# Patient Record
Sex: Female | Born: 1970
Health system: Southern US, Community
[De-identification: ages and names within clinical notes are randomized; demographics above are authoritative.]

## PROBLEM LIST (undated history)

## (undated) DIAGNOSIS — Z973 Presence of spectacles and contact lenses: Secondary | ICD-10-CM

## (undated) DIAGNOSIS — I10 Essential (primary) hypertension: Secondary | ICD-10-CM

## (undated) DIAGNOSIS — D649 Anemia, unspecified: Secondary | ICD-10-CM

## (undated) DIAGNOSIS — C50919 Malignant neoplasm of unspecified site of unspecified female breast: Secondary | ICD-10-CM

## (undated) DIAGNOSIS — Z923 Personal history of irradiation: Secondary | ICD-10-CM

## (undated) DIAGNOSIS — R12 Heartburn: Secondary | ICD-10-CM

## (undated) HISTORY — PX: MOUTH SURGERY: SHX715

---

## 2000-08-15 ENCOUNTER — Encounter: Payer: Self-pay | Admitting: Family Medicine

## 2000-08-15 ENCOUNTER — Encounter: Admission: RE | Admit: 2000-08-15 | Discharge: 2000-08-15 | Payer: Self-pay | Admitting: Family Medicine

## 2001-09-28 ENCOUNTER — Other Ambulatory Visit: Admission: RE | Admit: 2001-09-28 | Discharge: 2001-09-28 | Payer: Self-pay | Admitting: Family Medicine

## 2003-07-26 HISTORY — PX: LIPOMA EXCISION: SHX5283

## 2003-09-18 ENCOUNTER — Encounter: Admission: RE | Admit: 2003-09-18 | Discharge: 2003-10-06 | Payer: Self-pay | Admitting: Family Medicine

## 2003-10-15 ENCOUNTER — Ambulatory Visit (HOSPITAL_COMMUNITY): Admission: RE | Admit: 2003-10-15 | Discharge: 2003-10-15 | Payer: Self-pay | Admitting: General Surgery

## 2003-10-15 ENCOUNTER — Encounter (INDEPENDENT_AMBULATORY_CARE_PROVIDER_SITE_OTHER): Payer: Self-pay | Admitting: *Deleted

## 2003-10-15 ENCOUNTER — Ambulatory Visit (HOSPITAL_BASED_OUTPATIENT_CLINIC_OR_DEPARTMENT_OTHER): Admission: RE | Admit: 2003-10-15 | Discharge: 2003-10-15 | Payer: Self-pay | Admitting: General Surgery

## 2005-01-18 ENCOUNTER — Emergency Department (HOSPITAL_COMMUNITY): Admission: EM | Admit: 2005-01-18 | Discharge: 2005-01-18 | Payer: Self-pay | Admitting: Emergency Medicine

## 2007-01-17 ENCOUNTER — Emergency Department (HOSPITAL_COMMUNITY): Admission: EM | Admit: 2007-01-17 | Discharge: 2007-01-17 | Payer: Self-pay | Admitting: Emergency Medicine

## 2007-09-13 ENCOUNTER — Other Ambulatory Visit: Admission: RE | Admit: 2007-09-13 | Discharge: 2007-09-13 | Payer: Self-pay | Admitting: Obstetrics and Gynecology

## 2010-12-10 NOTE — Op Note (Signed)
NAME:  Erica Roberts, Erica Roberts                       ACCOUNT NO.:  [id number]   MEDICAL RECORD NO.:  [id number]                   PATIENT TYPE:  AMB   LOCATION:  DSC                                  FACILITY:  [facility]   PHYSICIAN:  Leonie Man, M.D.                DATE OF BIRTH:  11/20/1970   DATE OF PROCEDURE:  10/15/2003  DATE OF DISCHARGE:                                 OPERATIVE REPORT   PREOPERATIVE DIAGNOSIS:  Lipoma, left shoulder.   POSTOPERATIVE DIAGNOSIS:  Lipoma, left shoulder.   OPERATION PERFORMED:  Excision of lipoma of the left shoulder (6 cm).   SURGEON:  Leonie Man, M.D.   ANESTHESIA:  General.   INDICATIONS FOR PROCEDURE:  The patient is a 40 year old woman with an  enlarging mass over the distal third of the clavicle which has been  enlarging in size and causing some problems with her bra strap and other  clothing.  She comes to the operating room now for excision of this mass.  The risks and potential benefits of surgery have been discussed with the  patient and she understands and consents.   DESCRIPTION OF PROCEDURE:  The patient was induced with general anesthesia  and the left shoulder was prepped and draped to be included in the sterile  operative field.  An incision longitudinally along the left clavicle was  deepened through the skin and subcutaneous tissue down to the capsule.  The  capsule was opened.  The lipoma was dissected free from surrounding soft  tissue carrying it all the way down to the platysma muscles.  The lipoma was  removed and forwarded for pathologic evaluation.   Hemostasis assured with electrocautery.  Subcutaneous tissues closed with  interrupted 3-0 Vicryl sutures.  Skin closed with running 5-0 Monocryl  suture and then reinforced with Steri-Strips and a sterile compressive  dressing applied.  Anesthetic was reversed and the patient removed from the  operating room to the recovery room in stable condition, having tolerated  the  procedure well.                                               Leonie Man, M.D.    PB/MEDQ  D:  10/15/2003  T:  10/16/2003  Job:  [id number]

## 2011-05-11 LAB — URINE MICROSCOPIC-ADD ON

## 2011-05-11 LAB — URINALYSIS, ROUTINE W REFLEX MICROSCOPIC
Bilirubin Urine: NEGATIVE
Glucose, UA: NEGATIVE
Ketones, ur: NEGATIVE
Nitrite: NEGATIVE
Protein, ur: >300 — AB
Specific Gravity, Urine: 1.021
Urobilinogen, UA: 1
pH: 6.5

## 2011-05-11 LAB — POCT PREGNANCY, URINE
Operator id: 27065
Preg Test, Ur: NEGATIVE

## 2012-07-25 DIAGNOSIS — Z923 Personal history of irradiation: Secondary | ICD-10-CM

## 2012-07-25 HISTORY — DX: Personal history of irradiation: Z92.3

## 2013-01-31 ENCOUNTER — Other Ambulatory Visit: Payer: Self-pay | Admitting: Obstetrics and Gynecology

## 2013-02-20 ENCOUNTER — Other Ambulatory Visit: Payer: Self-pay

## 2013-02-20 DIAGNOSIS — Z1231 Encounter for screening mammogram for malignant neoplasm of breast: Secondary | ICD-10-CM

## 2013-02-21 ENCOUNTER — Other Ambulatory Visit: Payer: Self-pay | Admitting: Obstetrics and Gynecology

## 2013-02-25 ENCOUNTER — Other Ambulatory Visit: Payer: Self-pay | Admitting: Obstetrics and Gynecology

## 2013-02-25 NOTE — H&P (Signed)
Erica Roberts is a 42 y.o.  P 1-0-1-1 presents  for hysterectomy because of symptomatic uterine fibroids and menorrhagia.  For the past six months, in spite of using Nuva Ring, patient's menstrual flow has lasted 7 days and required her to change her protection every 30 minutes to a hour. She denies any significant cramping  but admits to positional dyspareunia, urinary frequency, rare post coital bleeding, random one day inter-menstrual spotting  and lower back pain.  She denies incontinence, constipation, pelvic pain, or vaginitis symptoms.  A TSH and Prolactin in July 2014 were normal and endometrial biopsy did not reveal any endometrial cell, endocervical sampling showed benigh mucosal  cells.  A pelvic ultrasound at that same time revealed a uterus: 8.83 x 8.13 x 6.68 cm, endometrium-0.481 cm with #3 fibroids: posterior-2.80 x 2.50 x 3.4 cm; left anterior-4.3 x 3.7 x 3.7 cm and posterior- 2.7 x 2.4 x 2.2 cm;  right ovary-2.18 x 1.31 x 1.51 cm and left ovary-2.67 x 1.47 x 1.09 cm.  A review of both medical and surgical management options were given to the patient however she desired to pursue definitive therapy in the form of hysterectomy.  Past Medical History  OB History: G 2 P 1-0-1-1 C-section, 1992  GYN History: menarche:42 YO;  LMP:02/04/2013    Contracepton NuvaRing vaginal inserts  The patient denies history of sexually transmitted disease.  Remote h istory of abnormal PAP smears that were simply repeated and returned normal;  Last PAP smear: 09/2012  Medical History: Hypertension  Surgical History: Negative Denies history of blood transfusions  Family History: Aneurysm, Hypertension and Diabetes Mellitus  Social History:   Divorced and employed with [employer]-works from home:  Admits to occasional alcolhol but denies tobacco or illicit drug use.  Medications:  Benicar  HCT 40/25  daily Nuva Ring as directed  NKDA Denies sensitivity to peanuts, shellfish, soy, latex  or adhesives.    ROS: Admits to reading glasses, urinary frequency, and  lower back pain;   Denies headache, vision changes, nasal congestion, dysphagia, tinnitus, dizziness, hoarseness, cough,  chest pain, shortness of breath, nausea, vomiting, diarrhea,constipation,  urinary urgency  dysuria, hematuria, vaginitis symptoms, pelvic pain, swelling of joints,easy bruising,  myalgias, arthralgias, skin rashes, unexplained weight loss and except as is mentioned in the history of present illness, patient's review of systems is otherwise negative.    Physical Exam  Bp: 126/86  P: 100    R: 16  Temp.: 98.3 degrees  Height: 5' 8.5"  Weight: 232   BMI: 34.8  Neck: supple without masses or thyromegaly Lungs: clear to auscultation Heart: regular rate and rhythm Abdomen: soft, non-tender and no organomegaly Pelvic:EGBUS- wnl; vagina-normal rugawith Nuva Ring in place,  uterus-ULNS, irregular,  cervix without lesions or motion tenderness; adnexae-no tenderness or masses Extremities:  no clubbing, cyanosis or edema   Assesment:   Symptomatic Uterine Fibroids                         Menorrhagia   Disposition:  A discussion was held with patient regarding the indication for her procedure(s) along with the risks, which include but are not limited to: reaction to anesthesia, damage to adjacent organs, infection, excessive bleeding, pelvic prolapse and the possible need for an open abdominal incision.  A Miralax Bowel Prep was given and reviewed to be completed the day before surgery. The patient verbalized understanding of these risks and pre-operative instructions and has consented to proceed  with a Total Laparoscopic Hysterectomy with Bilateral Salpingectomy, Cystoscopy, possible  Laparoscopically Assisted Vaginal Hysterectomy,  possible Total Abdominal Hysterectomy at [facility] [place] on March 04, 2013 at 2. p.m.   CSN# [id number]   Baylin Gamblin J. Lowell Guitar, PA-C  for Dr. Woodroe Mode.  Su Hilt

## 2013-02-27 ENCOUNTER — Ambulatory Visit
Admission: RE | Admit: 2013-02-27 | Discharge: 2013-02-27 | Disposition: A | Discharge status: Home / Self Care | Payer: BC Managed Care – PPO | Source: Ambulatory Visit

## 2013-02-27 ENCOUNTER — Other Ambulatory Visit: Payer: Self-pay | Admitting: Family Medicine

## 2013-02-27 DIAGNOSIS — R928 Other abnormal and inconclusive findings on diagnostic imaging of breast: Secondary | ICD-10-CM

## 2013-02-27 DIAGNOSIS — Z1231 Encounter for screening mammogram for malignant neoplasm of breast: Secondary | ICD-10-CM

## 2013-02-28 ENCOUNTER — Encounter (HOSPITAL_COMMUNITY): Payer: Self-pay

## 2013-02-28 ENCOUNTER — Encounter (HOSPITAL_COMMUNITY): Payer: Self-pay | Admitting: Pharmacist

## 2013-02-28 ENCOUNTER — Encounter (HOSPITAL_COMMUNITY)
Admission: RE | Admit: 2013-02-28 | Discharge: 2013-02-28 | Disposition: A | Discharge status: Home / Self Care | Payer: BC Managed Care – PPO | Source: Ambulatory Visit | Attending: Obstetrics and Gynecology | Admitting: Obstetrics and Gynecology

## 2013-02-28 ENCOUNTER — Other Ambulatory Visit: Payer: Self-pay

## 2013-02-28 HISTORY — DX: Heartburn: R12

## 2013-02-28 HISTORY — DX: Essential (primary) hypertension: I10

## 2013-02-28 HISTORY — DX: Anemia, unspecified: D64.9

## 2013-02-28 LAB — CBC
Hemoglobin: 11.6 g/dL — ABNORMAL LOW (ref 12.0–15.0)
MCH: 29 pg (ref 26.0–34.0)
MCV: 87 fL (ref 78.0–100.0)
Platelets: 313 10*3/uL (ref 150–400)
RBC: 4 MIL/uL (ref 3.87–5.11)
WBC: 5.8 10*3/uL (ref 4.0–10.5)

## 2013-02-28 LAB — BASIC METABOLIC PANEL
CO2: 26 mEq/L (ref 19–32)
GFR calc non Af Amer: 84 mL/min — ABNORMAL LOW (ref >90)
Glucose, Bld: 84 mg/dL (ref 70–99)
Potassium: 4.1 mEq/L (ref 3.5–5.1)
Sodium: 142 mEq/L (ref 135–145)

## 2013-02-28 NOTE — Patient Instructions (Addendum)
Your procedure is scheduled on:03/04/13  Enter through the Main Entrance at :1230pm Pick up desk phone and dial [phone] and inform us of your arrival.  Please call [phone] if you have any problems the morning of surgery.  Remember: Do not eat food after midnight: Sunday Clear liquids are ok until:10 am Monday   You may brush your teeth the morning of surgery.  Take these meds the morning of surgery with a sip of water:BP med  DO NOT wear jewelry, eye make-up, lipstick,body lotion, or dark fingernail polish.  (Polished toes are ok) You may wear deodorant.  If you are to be admitted after surgery, leave suitcase in car until your room has been assigned. Patients discharged on the day of surgery will not be allowed to drive home. Wear loose fitting, comfortable clothes for your ride home.

## 2013-03-03 MED ORDER — DEXTROSE 5 % IV SOLN
2.0000 g | INTRAVENOUS | Status: AC
  Administered 2013-03-04: 2 g via INTRAVENOUS
  Filled 2013-03-03: qty 2

## 2013-03-04 ENCOUNTER — Ambulatory Visit (HOSPITAL_COMMUNITY): Payer: BC Managed Care – PPO | Admitting: Anesthesiology

## 2013-03-04 ENCOUNTER — Encounter (HOSPITAL_COMMUNITY): Payer: Self-pay | Admitting: Anesthesiology

## 2013-03-04 ENCOUNTER — Observation Stay (HOSPITAL_COMMUNITY)
Admission: RE | Admit: 2013-03-04 | Discharge: 2013-03-05 | Disposition: A | Discharge status: Home / Self Care | Payer: BC Managed Care – PPO | Source: Ambulatory Visit | Attending: Obstetrics and Gynecology | Admitting: Obstetrics and Gynecology

## 2013-03-04 ENCOUNTER — Encounter (HOSPITAL_COMMUNITY)
Admission: RE | Disposition: A | Discharge status: Home / Self Care | Payer: Self-pay | Source: Ambulatory Visit | Attending: Obstetrics and Gynecology

## 2013-03-04 DIAGNOSIS — A5485 Gonococcal peritonitis: Secondary | ICD-10-CM | POA: Insufficient documentation

## 2013-03-04 DIAGNOSIS — D252 Subserosal leiomyoma of uterus: Secondary | ICD-10-CM | POA: Insufficient documentation

## 2013-03-04 DIAGNOSIS — D250 Submucous leiomyoma of uterus: Secondary | ICD-10-CM | POA: Insufficient documentation

## 2013-03-04 DIAGNOSIS — D251 Intramural leiomyoma of uterus: Secondary | ICD-10-CM | POA: Insufficient documentation

## 2013-03-04 DIAGNOSIS — N920 Excessive and frequent menstruation with regular cycle: Principal | ICD-10-CM | POA: Insufficient documentation

## 2013-03-04 DIAGNOSIS — I10 Essential (primary) hypertension: Secondary | ICD-10-CM | POA: Insufficient documentation

## 2013-03-04 HISTORY — PX: ABDOMINAL HYSTERECTOMY: SHX81

## 2013-03-04 HISTORY — PX: CYSTOSCOPY: SHX5120

## 2013-03-04 HISTORY — PX: LAPAROSCOPIC HYSTERECTOMY: SHX1926

## 2013-03-04 LAB — HCG, SERUM, QUALITATIVE: Preg, Serum: NEGATIVE

## 2013-03-04 SURGERY — HYSTERECTOMY, TOTAL, LAPAROSCOPIC
Anesthesia: General

## 2013-03-04 MED ORDER — FENTANYL CITRATE 0.05 MG/ML IJ SOLN
25.0000 ug | INTRAMUSCULAR | Status: DC | PRN
  Administered 2013-03-04: 50 ug via INTRAVENOUS

## 2013-03-04 MED ORDER — MIDAZOLAM HCL 5 MG/5ML IJ SOLN
INTRAMUSCULAR | Status: DC | PRN
  Administered 2013-03-04: 2 mg via INTRAVENOUS

## 2013-03-04 MED ORDER — LIDOCAINE HCL (CARDIAC) 20 MG/ML IV SOLN
INTRAVENOUS | Status: DC | PRN
  Administered 2013-03-04: 80 mg via INTRAVENOUS

## 2013-03-04 MED ORDER — DEXAMETHASONE SODIUM PHOSPHATE 10 MG/ML IJ SOLN
INTRAMUSCULAR | Status: DC | PRN
  Administered 2013-03-04: 10 mg via INTRAVENOUS

## 2013-03-04 MED ORDER — OXYCODONE-ACETAMINOPHEN 5-325 MG PO TABS
1.0000 | ORAL_TABLET | ORAL | Status: DC | PRN
  Administered 2013-03-05: 2 via ORAL
  Filled 2013-03-04: qty 2

## 2013-03-04 MED ORDER — GLYCOPYRROLATE 0.2 MG/ML IJ SOLN
INTRAMUSCULAR | Status: DC | PRN
  Administered 2013-03-04: 0.6 mg via INTRAVENOUS

## 2013-03-04 MED ORDER — HYDROCHLOROTHIAZIDE 25 MG PO TABS
25.0000 mg | ORAL_TABLET | Freq: Every day | ORAL | Status: DC
Start: 2013-03-05 — End: 2013-03-05
  Administered 2013-03-05: 25 mg via ORAL
  Filled 2013-03-04 (×2): qty 1

## 2013-03-04 MED ORDER — MIDAZOLAM HCL 2 MG/2ML IJ SOLN
INTRAMUSCULAR | Status: AC
  Filled 2013-03-04: qty 2

## 2013-03-04 MED ORDER — GLYCOPYRROLATE 0.2 MG/ML IJ SOLN
INTRAMUSCULAR | Status: AC
  Filled 2013-03-04: qty 3

## 2013-03-04 MED ORDER — LACTATED RINGERS IV SOLN
INTRAVENOUS | Status: DC
  Administered 2013-03-04 (×3): via INTRAVENOUS

## 2013-03-04 MED ORDER — ONDANSETRON HCL 4 MG/2ML IJ SOLN
INTRAMUSCULAR | Status: AC
  Filled 2013-03-04: qty 2

## 2013-03-04 MED ORDER — BUPIVACAINE HCL (PF) 0.25 % IJ SOLN
INTRAMUSCULAR | Status: AC
  Filled 2013-03-04: qty 30

## 2013-03-04 MED ORDER — MIDAZOLAM HCL 2 MG/2ML IJ SOLN: 0.5000 mg | Freq: Once | INTRAMUSCULAR | Status: DC | PRN

## 2013-03-04 MED ORDER — BACITRACIN-NEOMYCIN-POLYMYXIN 400-5-5000 EX OINT
TOPICAL_OINTMENT | CUTANEOUS | Status: AC
  Filled 2013-03-04: qty 1

## 2013-03-04 MED ORDER — LACTATED RINGERS IV SOLN
INTRAVENOUS | Status: DC
  Administered 2013-03-05: 01:00:00 via INTRAVENOUS

## 2013-03-04 MED ORDER — PROPOFOL 10 MG/ML IV EMUL
INTRAVENOUS | Status: AC
  Filled 2013-03-04: qty 20

## 2013-03-04 MED ORDER — KETOROLAC TROMETHAMINE 30 MG/ML IJ SOLN: 15.0000 mg | Freq: Once | INTRAMUSCULAR | Status: DC | PRN

## 2013-03-04 MED ORDER — MIDAZOLAM HCL 5 MG/5ML IJ SOLN: INTRAMUSCULAR | Status: DC | PRN

## 2013-03-04 MED ORDER — SODIUM CHLORIDE 0.9 % IJ SOLN: 9.0000 mL | INTRAMUSCULAR | Status: DC | PRN

## 2013-03-04 MED ORDER — HYDROMORPHONE HCL PF 1 MG/ML IJ SOLN
INTRAMUSCULAR | Status: AC
  Filled 2013-03-04: qty 1

## 2013-03-04 MED ORDER — PROMETHAZINE HCL 25 MG/ML IJ SOLN: 6.2500 mg | INTRAMUSCULAR | Status: DC | PRN

## 2013-03-04 MED ORDER — FENTANYL CITRATE 0.05 MG/ML IJ SOLN
INTRAMUSCULAR | Status: AC
  Filled 2013-03-04: qty 5

## 2013-03-04 MED ORDER — NALOXONE HCL 0.4 MG/ML IJ SOLN: 0.4000 mg | INTRAMUSCULAR | Status: DC | PRN

## 2013-03-04 MED ORDER — MEPERIDINE HCL 25 MG/ML IJ SOLN: 6.2500 mg | INTRAMUSCULAR | Status: DC | PRN

## 2013-03-04 MED ORDER — INDIGOTINDISULFONATE SODIUM 8 MG/ML IJ SOLN
INTRAMUSCULAR | Status: DC | PRN
  Administered 2013-03-04: 5 mL via INTRAVENOUS

## 2013-03-04 MED ORDER — DEXAMETHASONE SODIUM PHOSPHATE 10 MG/ML IJ SOLN
INTRAMUSCULAR | Status: AC
  Filled 2013-03-04: qty 1

## 2013-03-04 MED ORDER — PROPOFOL 10 MG/ML IV BOLUS
INTRAVENOUS | Status: DC | PRN
  Administered 2013-03-04: 200 mg via INTRAVENOUS

## 2013-03-04 MED ORDER — NEOSTIGMINE METHYLSULFATE 1 MG/ML IJ SOLN
INTRAMUSCULAR | Status: DC | PRN
  Administered 2013-03-04: 3 mg via INTRAVENOUS

## 2013-03-04 MED ORDER — FENTANYL CITRATE 0.05 MG/ML IJ SOLN
INTRAMUSCULAR | Status: DC | PRN
  Administered 2013-03-04: 25 ug via INTRAVENOUS
  Administered 2013-03-04 (×4): 50 ug via INTRAVENOUS
  Administered 2013-03-04: 25 ug via INTRAVENOUS
  Administered 2013-03-04 (×2): 50 ug via INTRAVENOUS
  Administered 2013-03-04: 100 ug via INTRAVENOUS

## 2013-03-04 MED ORDER — DIPHENHYDRAMINE HCL 12.5 MG/5ML PO ELIX
12.5000 mg | ORAL_SOLUTION | Freq: Four times a day (QID) | ORAL | Status: DC | PRN
  Administered 2013-03-05: 12.5 mg via ORAL
  Filled 2013-03-04: qty 5

## 2013-03-04 MED ORDER — LIDOCAINE HCL (CARDIAC) 20 MG/ML IV SOLN
INTRAVENOUS | Status: AC
  Filled 2013-03-04: qty 5

## 2013-03-04 MED ORDER — LIDOCAINE HCL (CARDIAC) 20 MG/ML IV SOLN: INTRAVENOUS | Status: DC | PRN

## 2013-03-04 MED ORDER — ONDANSETRON HCL 4 MG/2ML IJ SOLN: 4.0000 mg | Freq: Four times a day (QID) | INTRAMUSCULAR | Status: DC | PRN

## 2013-03-04 MED ORDER — HYDROMORPHONE 0.3 MG/ML IV SOLN
INTRAVENOUS | Status: DC
  Administered 2013-03-04: 22:00:00 via INTRAVENOUS
  Administered 2013-03-05: 1.59 mg via INTRAVENOUS
  Administered 2013-03-05: 2 mL via INTRAVENOUS
  Administered 2013-03-05: 1.79 mg via INTRAVENOUS
  Filled 2013-03-04: qty 25

## 2013-03-04 MED ORDER — KETOROLAC TROMETHAMINE 30 MG/ML IJ SOLN
INTRAMUSCULAR | Status: DC | PRN
  Administered 2013-03-04: 30 mg via INTRAVENOUS

## 2013-03-04 MED ORDER — ROCURONIUM BROMIDE 50 MG/5ML IV SOLN
INTRAVENOUS | Status: AC
  Filled 2013-03-04: qty 1

## 2013-03-04 MED ORDER — FENTANYL CITRATE 0.05 MG/ML IJ SOLN
INTRAMUSCULAR | Status: AC
  Administered 2013-03-04: 50 ug via INTRAVENOUS
  Filled 2013-03-04: qty 2

## 2013-03-04 MED ORDER — DIPHENHYDRAMINE HCL 50 MG/ML IJ SOLN: 12.5000 mg | Freq: Four times a day (QID) | INTRAMUSCULAR | Status: DC | PRN

## 2013-03-04 MED ORDER — KETOROLAC TROMETHAMINE 30 MG/ML IJ SOLN: 30.0000 mg | Freq: Four times a day (QID) | INTRAMUSCULAR | Status: DC

## 2013-03-04 MED ORDER — IRBESARTAN 300 MG PO TABS
300.0000 mg | ORAL_TABLET | Freq: Every day | ORAL | Status: DC
  Administered 2013-03-05: 300 mg via ORAL
  Filled 2013-03-04 (×2): qty 1

## 2013-03-04 MED ORDER — VASOPRESSIN 20 UNIT/ML IJ SOLN
INTRAMUSCULAR | Status: AC
  Filled 2013-03-04: qty 1

## 2013-03-04 MED ORDER — IBUPROFEN 600 MG PO TABS
600.0000 mg | ORAL_TABLET | Freq: Four times a day (QID) | ORAL | Status: DC | PRN
  Administered 2013-03-05: 600 mg via ORAL
  Filled 2013-03-04: qty 1

## 2013-03-04 MED ORDER — FENTANYL CITRATE 0.05 MG/ML IJ SOLN
INTRAMUSCULAR | Status: AC
  Filled 2013-03-04: qty 2

## 2013-03-04 MED ORDER — ONDANSETRON HCL 4 MG/2ML IJ SOLN
INTRAMUSCULAR | Status: DC | PRN
  Administered 2013-03-04: 4 mg via INTRAVENOUS

## 2013-03-04 MED ORDER — OLMESARTAN MEDOXOMIL-HCTZ 40-25 MG PO TABS: 1.0000 | ORAL_TABLET | Freq: Every day | ORAL | Status: DC

## 2013-03-04 MED ORDER — ESTRADIOL 0.1 MG/GM VA CREA
TOPICAL_CREAM | VAGINAL | Status: AC
  Filled 2013-03-04: qty 42.5

## 2013-03-04 MED ORDER — INDIGOTINDISULFONATE SODIUM 8 MG/ML IJ SOLN
INTRAMUSCULAR | Status: AC
  Filled 2013-03-04: qty 5

## 2013-03-04 MED ORDER — ROCURONIUM BROMIDE 100 MG/10ML IV SOLN
INTRAVENOUS | Status: DC | PRN
  Administered 2013-03-04: 5 mg via INTRAVENOUS
  Administered 2013-03-04: 10 mg via INTRAVENOUS
  Administered 2013-03-04: 50 mg via INTRAVENOUS
  Administered 2013-03-04 (×2): 10 mg via INTRAVENOUS

## 2013-03-04 MED ORDER — FENTANYL CITRATE 0.05 MG/ML IJ SOLN: INTRAMUSCULAR | Status: DC | PRN

## 2013-03-04 MED ORDER — NEOSTIGMINE METHYLSULFATE 1 MG/ML IJ SOLN
INTRAMUSCULAR | Status: AC
  Filled 2013-03-04: qty 1

## 2013-03-04 MED ORDER — HYDROMORPHONE HCL PF 1 MG/ML IJ SOLN
INTRAMUSCULAR | Status: DC | PRN
  Administered 2013-03-04: 1 mg via INTRAVENOUS

## 2013-03-04 SURGICAL SUPPLY — 88 items
ADH SKN CLS APL DERMABOND .7 (GAUZE/BANDAGES/DRESSINGS) ×2
BARRIER ADHS 3X4 INTERCEED (GAUZE/BANDAGES/DRESSINGS) IMPLANT
BLADE SURG 10 STRL SS (BLADE) ×1 IMPLANT
BRR ADH 4X3 ABS CNTRL BYND (GAUZE/BANDAGES/DRESSINGS)
CABLE HIGH FREQUENCY MONO STRZ (ELECTRODE) IMPLANT
CANISTER SUCTION 2500CC (MISCELLANEOUS) ×3 IMPLANT
CATH ROBINSON RED A/P 16FR (CATHETERS) IMPLANT
CHLORAPREP W/TINT 26ML (MISCELLANEOUS) ×3 IMPLANT
CLOTH BEACON ORANGE TIMEOUT ST (SAFETY) ×3 IMPLANT
CONT PATH 16OZ SNAP LID 3702 (MISCELLANEOUS) ×3 IMPLANT
COVER MAYO STAND STRL (DRAPES) ×3 IMPLANT
COVER TABLE BACK 60X90 (DRAPES) ×3 IMPLANT
DECANTER SPIKE VIAL GLASS SM (MISCELLANEOUS) IMPLANT
DERMABOND ADVANCED (GAUZE/BANDAGES/DRESSINGS) ×1
DERMABOND ADVANCED .7 DNX12 (GAUZE/BANDAGES/DRESSINGS) ×2 IMPLANT
DISSECTOR BLUNT TIP ENDO 5MM (MISCELLANEOUS) IMPLANT
DISSECTOR SPONGE CHERRY (GAUZE/BANDAGES/DRESSINGS) ×3 IMPLANT
DRAPE HYSTEROSCOPY (DRAPE) ×3 IMPLANT
DRAPE PROXIMA HALF (DRAPES) ×3 IMPLANT
ELECT REM PT RETURN 9FT ADLT (ELECTROSURGICAL) ×3
ELECTRODE REM PT RTRN 9FT ADLT (ELECTROSURGICAL) ×2 IMPLANT
EVACUATOR SMOKE 8.L (FILTER) ×6 IMPLANT
FORCEPS CUTTING 33CM 5MM (CUTTING FORCEPS) ×3 IMPLANT
GAUZE PACKING 2X5 YD STERILE (GAUZE/BANDAGES/DRESSINGS) IMPLANT
GAUZE SPONGE 4X4 16PLY XRAY LF (GAUZE/BANDAGES/DRESSINGS) ×4 IMPLANT
GLOVE BIO SURGEON STRL SZ7.5 (GLOVE) ×3 IMPLANT
GLOVE BIOGEL PI IND STRL 7.5 (GLOVE) ×4 IMPLANT
GLOVE BIOGEL PI INDICATOR 7.5 (GLOVE) ×4
GOWN PREVENTION PLUS LG XLONG (DISPOSABLE) ×6 IMPLANT
GOWN STRL REIN XL XLG (GOWN DISPOSABLE) ×12 IMPLANT
HEMOSTAT SURGICEL 2X14 (HEMOSTASIS) IMPLANT
HEMOSTAT SURGICEL 4X8 (HEMOSTASIS) IMPLANT
NDL HYPO 25X1 1.5 SAFETY (NEEDLE) IMPLANT
NEEDLE HYPO 25X1 1.5 SAFETY (NEEDLE) IMPLANT
NEEDLE INSUFFLATION 120MM (ENDOMECHANICALS) ×3 IMPLANT
NEEDLE MAYO .5 CIRCLE (NEEDLE) IMPLANT
NS IRRIG 1000ML POUR BTL (IV SOLUTION) ×3 IMPLANT
OCCLUDER COLPOPNEUMO (BALLOONS) ×4 IMPLANT
PACK LAPAROSCOPY BASIN (CUSTOM PROCEDURE TRAY) ×3 IMPLANT
PACK LAVH (CUSTOM PROCEDURE TRAY) ×3 IMPLANT
PACK VAGINAL WOMENS (CUSTOM PROCEDURE TRAY) ×3 IMPLANT
PAD OB MATERNITY 4.3X12.25 (PERSONAL CARE ITEMS) ×3 IMPLANT
PROTECTOR NERVE ULNAR (MISCELLANEOUS) ×3 IMPLANT
SCALPEL HARMONIC ACE (MISCELLANEOUS) ×1 IMPLANT
SCISSORS LAP 5X35 DISP (ENDOMECHANICALS) IMPLANT
SET CYSTO W/LG BORE CLAMP LF (SET/KITS/TRAYS/PACK) ×3 IMPLANT
SET IRRIG TUBING LAPAROSCOPIC (IRRIGATION / IRRIGATOR) ×3 IMPLANT
SOLUTION ELECTROLUBE (MISCELLANEOUS) IMPLANT
SPONGE LAP 18X18 X RAY DECT (DISPOSABLE) ×6 IMPLANT
STAPLER VISISTAT 35W (STAPLE) IMPLANT
STRIP CLOSURE SKIN 1/4X3 (GAUZE/BANDAGES/DRESSINGS) IMPLANT
STRIP CLOSURE SKIN 1/4X4 (GAUZE/BANDAGES/DRESSINGS) IMPLANT
SUT CHROMIC 2 0 CT 1 (SUTURE) ×2 IMPLANT
SUT CHROMIC 2 0 SH (SUTURE) IMPLANT
SUT CHROMIC 2 0 TIES 18 (SUTURE) IMPLANT
SUT MNCRL AB 3-0 PS2 27 (SUTURE) ×7 IMPLANT
SUT MON AB 3-0 SH 27 (SUTURE)
SUT MON AB 3-0 SH27 (SUTURE) IMPLANT
SUT MON AB 4-0 PS1 27 (SUTURE) ×3 IMPLANT
SUT PDS AB 1 CT1 36 (SUTURE) ×6 IMPLANT
SUT PDS AB 1 CTX 36 (SUTURE) IMPLANT
SUT PLAIN 2 0 XLH (SUTURE) ×2 IMPLANT
SUT VIC AB 0 CT1 18XCR BRD8 (SUTURE) ×6 IMPLANT
SUT VIC AB 0 CT1 27 (SUTURE) ×9
SUT VIC AB 0 CT1 27XBRD ANBCTR (SUTURE) ×8 IMPLANT
SUT VIC AB 0 CT1 36 (SUTURE) ×2 IMPLANT
SUT VIC AB 0 CT1 8-18 (SUTURE)
SUT VICRYL 0 TIES 12 18 (SUTURE) ×2 IMPLANT
SUT VICRYL 0 UR6 27IN ABS (SUTURE) ×6 IMPLANT
SYR 50ML LL SCALE MARK (SYRINGE) ×3 IMPLANT
SYR CONTROL 10ML LL (SYRINGE) IMPLANT
SYR TB 1ML LUER SLIP (SYRINGE) ×3 IMPLANT
TIP RUMI ORANGE 6.7MMX12CM (TIP) ×1 IMPLANT
TIP UTERINE 5.1X6CM LAV DISP (MISCELLANEOUS) IMPLANT
TIP UTERINE 6.7X10CM GRN DISP (MISCELLANEOUS) ×1 IMPLANT
TIP UTERINE 6.7X6CM WHT DISP (MISCELLANEOUS) IMPLANT
TIP UTERINE 6.7X8CM BLUE DISP (MISCELLANEOUS) IMPLANT
TOWEL OR 17X24 6PK STRL BLUE (TOWEL DISPOSABLE) ×6 IMPLANT
TRAY FOLEY CATH 14FR (SET/KITS/TRAYS/PACK) ×3 IMPLANT
TROCAR BALLN 12MMX100 BLUNT (TROCAR) ×1 IMPLANT
TROCAR XCEL NON-BLD 11X100MML (ENDOMECHANICALS) ×6 IMPLANT
TROCAR XCEL NON-BLD 5MMX100MML (ENDOMECHANICALS) ×3 IMPLANT
TROCAR XCEL OPT SLVE 5M 100M (ENDOMECHANICALS) ×6 IMPLANT
TUBING CONNECTOR 18X5MM (MISCELLANEOUS) ×1 IMPLANT
TUBING FILTER THERMOFLATOR (ELECTROSURGICAL) ×3 IMPLANT
WARMER LAPAROSCOPE (MISCELLANEOUS) ×3 IMPLANT
WATER STERILE IRR 1000ML POUR (IV SOLUTION) ×3 IMPLANT
YANKAUER SUCT BULB TIP NO VENT (SUCTIONS) ×1 IMPLANT

## 2013-03-04 NOTE — Interval H&P Note (Signed)
History and Physical Interval Note:  03/04/2013 2:28 PM  Erica Roberts  has presented today for surgery, with the diagnosis of Menorrhagia; CPT [phone] - RM 4  The various methods of treatment have been discussed with the patient and family. After consideration of risks, benefits and other options for treatment, the patient has consented to  Procedure(s) with comments: HYSTERECTOMY TOTAL LAPAROSCOPIC (N/A) - TLH possible LAVH possible TAH, Cystoscopy BILATERAL SALPINGECTOMY POSSIBLE LAPAROSCOPIC ASSISTED VAGINAL HYSTERECTOMY (N/A) POSSIBLE HYSTERECTOMY ABDOMINAL (N/A) CYSTOSCOPY as a surgical intervention .  The patient's history has been reviewed, patient examined, no change in status, stable for surgery.  I have reviewed the patient's chart and labs.  Questions were answered to the patient's satisfaction.     Purcell Nails

## 2013-03-04 NOTE — Op Note (Signed)
Preop Diagnosis: Menorrhagia; CPT [id number] - RM 4   Postop Diagnosis: Menorrhagia   Procedure: HYSTERECTOMY TOTAL LAPAROSCOPIC BILATERAL SALPINGECTOMY CYSTOSCOPY   Anesthesia: General    Attending: Purcell Nails, MD   Assistant: Jaymes Graff, MD  Findings: Several fibroids and noted was fitz hugh curtis with small amount of adhesions to the adnexa  Pathology: uterus and cervix (greater than 250g)  Fluids: 2600 cc  UOP: 350 cc  EBL: 150 cc  Complications: None  Procedure: The patient was taken to the operating room, placed under general anesthesia and prepped and draped in the normal sterile fashion. A Foley catheter was placed in the bladder. The uterus sounded to 12 cm. A weighted speculum and vaginal retractors were placed in the vagina. Tenaculum was placed on the anterior lip of the cervix.  A size 10 cm tip was used and the rumi was placed, tip balloon and occluder insufflated. Attention was then turned to the abdomen. A 10 mm infraumbilical incision was made with the scalpel after 5 cc of 0.25% percent Marcaine was used for local anesthesia. The subcutaneous tissue was dissected and the fascia was incised with the knife. A purse string stitch was placed in the fascia and Hassan placed into the intra-abdominal cavity and anchored to the suture. Intraabdominal placement was confirmed with the laparoscope.  Two 5 mm trochars were placed in the right and left lower quadrants under direct visualization with the laparoscope.  A 10 mm incision was made in the suprapubic area.  The harmonic scalpel was used to cauterize and cut the uterine ovarian ligaments bilaterally.   Both round ligaments were cauterized and cut with the harmonic scalpel as well and the bladder flap created with the harmonic scalpel and removed away from the uterus. Both uterine arteries were cauterized and cut with harmonic scalpel.  The harmonic was used to circumscribe the [facility] ring and free the uterus and cervix.  The uterus was then pulled into the vagina.  Both angles were sutured with 1 PDS.  The  remainder of the cuff was sutured with 1 PDS using interrupted stitches until the vaginal cuff was closed.  A total of approximately 6 sutures were used.   Irrigation was performed.  Gas was allowed to leave the abdomen to check for bleeders and all pedicles were seen to be hemostatic the patient was given indigo carmine.  Cystoscopy was performed and both ureters were seen to efflux indigo carmine without difficulty. The bladder had full integrity with no suture or laceration visualized.  The vagina was inspected and the cuff was noted to be intact.  Attention was then turned back to the abdomen after removing top pair of gloves. The abdomen was reinsufflated with CO2 gas.   The abdomen and pelvis was copiously irrigated and hemostasis was assured by cauterizing small oozing vessels and placing surgicel.  The 10 mm suprapubic incision was repaired with 0 Vicryl using the fascial closure device.  All trochars were removed under direct visualization using the laparoscope.  The umbilical fascia was reapproximated using 0 vicryl figure of eight stitch.  The two 10 mm incisions were closed with 3-0 Monocryl via a subcuticular stitch.  All remaining skin incisions were closed with Dermabond and the 10 mm skin incisions were reinforced using Dermabond.  Sponge lap and needle counts were correct.  The patient tolerated the procedure well and was returned to the PACU in stable condition

## 2013-03-04 NOTE — Anesthesia Postprocedure Evaluation (Signed)
  Anesthesia Post-op Note  Patient: Erica Roberts  Procedure(s) Performed: Procedure(s) with comments: HYSTERECTOMY TOTAL LAPAROSCOPIC (N/A) - TLH possible LAVH possiblel TAH, Cystoscopy CYSTOSCOPY (Bilateral)  Patient Location: PACU  Anesthesia Type:General  Level of Consciousness: awake, alert  and oriented  Airway and Oxygen Therapy: Patient Spontanous Breathing  Post-op Pain: none  Post-op Assessment: Post-op Vital signs reviewed, Patient's Cardiovascular Status Stable, Respiratory Function Stable, Patent Airway, No signs of Nausea or vomiting and Pain level controlled  Post-op Vital Signs: Reviewed and stable  Complications: No apparent anesthesia complications

## 2013-03-04 NOTE — H&P (View-Only) (Signed)
Erica Roberts is a 42 y.o.  P 1-0-1-1 presents  for hysterectomy because of symptomatic uterine fibroids and menorrhagia.  For the past six months, in spite of using Nuva Ring, patient's menstrual flow has lasted 7 days and required her to change her protection every 30 minutes to a hour. She denies any significant cramping  but admits to positional dyspareunia, urinary frequency, rare post coital bleeding, random one day inter-menstrual spotting  and lower back pain.  She denies incontinence, constipation, pelvic pain, or vaginitis symptoms.  A TSH and Prolactin in July 2014 were normal and endometrial biopsy did not reveal any endometrial cell, endocervical sampling showed benigh mucosal  cells.  A pelvic ultrasound at that same time revealed a uterus: 8.83 x 8.13 x 6.68 cm, endometrium-0.481 cm with #3 fibroids: posterior-2.80 x 2.50 x 3.4 cm; left anterior-4.3 x 3.7 x 3.7 cm and posterior- 2.7 x 2.4 x 2.2 cm;  right ovary-2.18 x 1.31 x 1.51 cm and left ovary-2.67 x 1.47 x 1.09 cm.  A review of both medical and surgical management options were given to the patient however she desired to pursue definitive therapy in the form of hysterectomy.  Past Medical History  OB History: G 2 P 1-0-1-1 C-section, 1992  GYN History: menarche:42 YO;  LMP:02/04/2013    Contracepton NuvaRing vaginal inserts  The patient denies history of sexually transmitted disease.  Remote h istory of abnormal PAP smears that were simply repeated and returned normal;  Last PAP smear: 09/2012  Medical History: Hypertension  Surgical History: Negative Denies history of blood transfusions  Family History: Aneurysm, Hypertension and Diabetes Mellitus  Social History:   Divorced and employed with [employer]-works from home:  Admits to occasional alcolhol but denies tobacco or illicit drug use.  Medications:  Benicar  HCT 40/25  daily Nuva Ring as directed  NKDA Denies sensitivity to peanuts, shellfish, soy, latex  or adhesives.    ROS: Admits to reading glasses, urinary frequency, and  lower back pain;   Denies headache, vision changes, nasal congestion, dysphagia, tinnitus, dizziness, hoarseness, cough,  chest pain, shortness of breath, nausea, vomiting, diarrhea,constipation,  urinary urgency  dysuria, hematuria, vaginitis symptoms, pelvic pain, swelling of joints,easy bruising,  myalgias, arthralgias, skin rashes, unexplained weight loss and except as is mentioned in the history of present illness, patient's review of systems is otherwise negative.    Physical Exam  Bp: 126/86  P: 100    R: 16  Temp.: 98.3 degrees  Height: 5' 8.5"  Weight: 232   BMI: 34.8  Neck: supple without masses or thyromegaly Lungs: clear to auscultation Heart: regular rate and rhythm Abdomen: soft, non-tender and no organomegaly Pelvic:EGBUS- wnl; vagina-normal rugawith Nuva Ring in place,  uterus-ULNS, irregular,  cervix without lesions or motion tenderness; adnexae-no tenderness or masses Extremities:  no clubbing, cyanosis or edema   Assesment:   Symptomatic Uterine Fibroids                         Menorrhagia   Disposition:  A discussion was held with patient regarding the indication for her procedure(s) along with the risks, which include but are not limited to: reaction to anesthesia, damage to adjacent organs, infection, excessive bleeding, pelvic prolapse and the possible need for an open abdominal incision.  A Miralax Bowel Prep was given and reviewed to be completed the day before surgery. The patient verbalized understanding of these risks and pre-operative instructions and has consented to proceed   with a Total Laparoscopic Hysterectomy with Bilateral Salpingectomy, Cystoscopy, possible  Laparoscopically Assisted Vaginal Hysterectomy,  possible Total Abdominal Hysterectomy at [facility] on March 04, 2013 at 2. p.m.   CSN# [id number]   Malala Trenkamp J. Chozen Latulippe, PA-C  for Dr. Angela Y.  Roberts    

## 2013-03-04 NOTE — Transfer of Care (Signed)
Immediate Anesthesia Transfer of Care Note  Patient: [name]  Procedure(s) Performed: Procedure(s) with comments: HYSTERECTOMY TOTAL LAPAROSCOPIC (N/A) - TLH possible LAVH possiblel TAH, Cystoscopy CYSTOSCOPY (Bilateral)  Patient Location: PACU  Anesthesia Type:General  Level of Consciousness: awake, alert , oriented and patient cooperative  Airway & Oxygen Therapy: Patient Spontanous Breathing and Patient connected to nasal cannula oxygen  Post-op Assessment: Report given to PACU RN and Post -op Vital signs reviewed and stable  Post vital signs: stable  Complications: No apparent anesthesia complications

## 2013-03-04 NOTE — Anesthesia Preprocedure Evaluation (Signed)
Anesthesia Evaluation  Patient identified by MRN, date of birth, ID band Patient awake    Reviewed: Allergy & Precautions, H&P , Patient's Chart, lab work & pertinent test results, reviewed documented beta blocker date and time   History of Anesthesia Complications Negative for: history of anesthetic complications  Airway Mallampati: III TM Distance: >3 FB Neck ROM: full    Dental no notable dental hx.    Pulmonary neg pulmonary ROS,  breath sounds clear to auscultation  Pulmonary exam normal       Cardiovascular Exercise Tolerance: Good hypertension, negative cardio ROS  Rhythm:regular Rate:Normal     Neuro/Psych negative neurological ROS  negative psych ROS   GI/Hepatic negative GI ROS, Neg liver ROS,   Endo/Other  negative endocrine ROSMorbid obesity  Renal/GU negative Renal ROS     Musculoskeletal   Abdominal   Peds  Hematology negative hematology ROS (+) anemia ,   Anesthesia Other Findings Hypertension     Heartburn        Anemia          Reproductive/Obstetrics negative OB ROS                           Anesthesia Physical Anesthesia Plan  ASA: III  Anesthesia Plan: General ETT   Post-op Pain Management:    Induction:   Airway Management Planned:   Additional Equipment:   Intra-op Plan:   Post-operative Plan:   Informed Consent: I have reviewed the patients History and Physical, chart, labs and discussed the procedure including the risks, benefits and alternatives for the proposed anesthesia with the patient or authorized representative who has indicated his/her understanding and acceptance.   Dental Advisory Given  Plan Discussed with: CRNA and Surgeon  Anesthesia Plan Comments:         Anesthesia Quick Evaluation

## 2013-03-04 NOTE — Anesthesia Procedure Notes (Signed)
Procedure Name: Intubation Date/Time: 03/04/2013 4:03 PM Performed by: Graciela Husbands Pre-anesthesia Checklist: Suction available, Emergency Drugs available, Timeout performed, Patient identified and Patient being monitored Patient Re-evaluated:Patient Re-evaluated prior to inductionOxygen Delivery Method: Circle system utilized Preoxygenation: Pre-oxygenation with 100% oxygen Intubation Type: IV induction Ventilation: Mask ventilation without difficulty Laryngoscope Size: Mac and 3 Grade View: Grade I Tube type: Oral Laser Tube: Cuffed inflated with minimal occlusive pressure - saline Tube size: 7.0 mm Number of attempts: 1 Airway Equipment and Method: Stylet Placement Confirmation: ETT inserted through vocal cords under direct vision and positive ETCO2 Secured at: 22 cm Tube secured with: Tape Dental Injury: Teeth and Oropharynx as per pre-operative assessment

## 2013-03-05 ENCOUNTER — Encounter (HOSPITAL_COMMUNITY): Payer: Self-pay | Admitting: Obstetrics and Gynecology

## 2013-03-05 LAB — CBC
Hemoglobin: 9.9 g/dL — ABNORMAL LOW (ref 12.0–15.0)
MCV: 86 fL (ref 78.0–100.0)
Platelets: 274 10*3/uL (ref 150–400)
RBC: 3.44 MIL/uL — ABNORMAL LOW (ref 3.87–5.11)
WBC: 10.4 10*3/uL (ref 4.0–10.5)

## 2013-03-05 MED ORDER — ONDANSETRON HCL 4 MG PO TABS: 4.0000 mg | ORAL_TABLET | Freq: Three times a day (TID) | ORAL | Status: DC | PRN

## 2013-03-05 MED ORDER — OXYCODONE-ACETAMINOPHEN 5-325 MG PO TABS: 1.0000 | ORAL_TABLET | ORAL | Status: DC | PRN

## 2013-03-05 MED ORDER — IBUPROFEN 600 MG PO TABS: ORAL_TABLET | ORAL | Status: DC

## 2013-03-05 NOTE — Progress Notes (Signed)
[name] is a28 y.o.  [id number]  Post Op Date # 1: TLH/BS/Cystoscopy  Subjective: Patient is Doing well postoperatively. Patient has Pain is controlled with current analgesics. Medications being used: PCA Dilaudid and oral Ibuprofen., Transient lightheadedness and nausea with initial ambulation, tolerating liquids but has not voided since Foley removed.  Objective: Vital signs in last 24 hours: Temp:  [97.5 F (36.4 C)-98.8 F (37.1 C)] 98.5 F (36.9 C) (08/12 0628) Pulse Rate:  [76-91] 79 (08/12 0628) Resp:  [11-18] 16 (08/12 0628) BP: (101-143)/(35-89) 101/65 mmHg (08/12 0628) SpO2:  [91 %-100 %] 98 % (08/12 0628)  Intake/Output from previous day: 08/11 0701 - 08/12 0700 In: 4200.5 [P.O.:440; I.V.:3760.5] Out: 1700 [Urine:1550] Intake/Output this shift:    Recent Labs Lab 02/28/13 1020 03/05/13 0509  WBC 5.8 10.4  HGB 11.6* 9.9*  HCT 34.8* 29.6*  PLT 313 274     Recent Labs Lab 02/28/13 1020  NA 142  K 4.1  CL 106  CO2 26  BUN 14  CREATININE 0.84  CALCIUM 10.2  GLUCOSE 84    EXAM: General: alert, cooperative and no distress Resp: clear to auscultation bilaterally Cardio: regular rate and rhythm, S1, S2 normal, no murmur, click, rub or gallop GI: Soft, bowel sounds present, incisions intact without evidence of infection Extremities: SCD hose in place and functioning; no calf tenderness. Vaginal Bleeding: none   Assessment: s/p Procedure(s): HYSTERECTOMY TOTAL LAPAROSCOPIC CYSTOSCOPY: stable, progressing well and anemia  Plan: Advance diet Encourage ambulation Advance to PO medication Routine care  LOS: 1 day    POWELL,ELMIRA, PA-C 03/05/2013 7:40 AM   Agree with above.  No further N/V.  Minimal spotting.  D/c instructions discussed.  Pt voided without difficulty.

## 2013-03-05 NOTE — Progress Notes (Signed)
Ur chart review completed.  

## 2013-03-05 NOTE — Discharge Summary (Signed)
  Physician Discharge Summary  Patient ID: Erica Roberts MRN: [id number] DOB/AGE: 44-Aug-1972 44 y.o.  Admit date: 03/04/2013 Discharge date: 03/05/2013   Discharge Diagnoses:  Menorrhagia,  Uterine Fibroids and Fitz-Hugh-Curtis Active Problems:   * No active hospital problems. *   Operation: Total Laparoscopic Hysterectomy with Bilateral Salpingectomy and Cystoscopy   Discharged Condition: Good  Hospital Course: On the date of admission the patient underwent the aforementioned procedures, tolerating them well.  Post operative course was unremarkable with the patient resuming bowel and bladder function by post operative day #1 and therefore was deemed ready for discharge home.  Disposition: Home to Self Care  Discharge Medications:    Medication List    TAKE these medications       ibuprofen 600 MG tablet  Commonly known as:  ADVIL,MOTRIN  1 po pc every 6 hours for 5 days then prn-pain     olmesartan-hydrochlorothiazide 40-25 MG per tablet  Commonly known as:  BENICAR HCT  Take 1 tablet by mouth daily.     ondansetron 4 MG tablet  Commonly known as:  ZOFRAN  Take 1 tablet (4 mg total) by mouth every 8 (eight) hours as needed for nausea.     oxyCODONE-acetaminophen 5-325 MG per tablet  Commonly known as:  PERCOCET/ROXICET  Take 1-2 tablets by mouth every 4 (four) hours as needed.      ASK your doctor about these medications       etonogestrel-ethinyl estradiol 0.12-0.015 MG/24HR vaginal ring  Commonly known as:  NUVARING  Place 1 each vaginally every 28 (twenty-eight) days. Insert vaginally and leave in place for 3 consecutive weeks, then remove for 1 week.         Follow-up: Dr. Woodroe Mode. Su Hilt,  April 05, 2013 at 2 p.m.   SignedHenreitta Leber, PA-C 03/05/2013, 7:56 AM

## 2013-03-05 NOTE — Progress Notes (Signed)
Pt ambulated out  Teaching complete 

## 2013-03-18 ENCOUNTER — Ambulatory Visit
Admission: RE | Admit: 2013-03-18 | Discharge: 2013-03-18 | Disposition: A | Discharge status: Home / Self Care | Payer: BC Managed Care – PPO | Source: Ambulatory Visit | Attending: Family Medicine | Admitting: Family Medicine

## 2013-03-18 ENCOUNTER — Other Ambulatory Visit: Payer: Self-pay | Admitting: Family Medicine

## 2013-03-18 DIAGNOSIS — R928 Other abnormal and inconclusive findings on diagnostic imaging of breast: Secondary | ICD-10-CM

## 2013-03-28 ENCOUNTER — Ambulatory Visit
Admission: RE | Admit: 2013-03-28 | Discharge: 2013-03-28 | Disposition: A | Discharge status: Home / Self Care | Payer: BC Managed Care – PPO | Source: Ambulatory Visit | Attending: Family Medicine | Admitting: Family Medicine

## 2013-03-28 DIAGNOSIS — R928 Other abnormal and inconclusive findings on diagnostic imaging of breast: Secondary | ICD-10-CM

## 2013-03-28 DIAGNOSIS — C50919 Malignant neoplasm of unspecified site of unspecified female breast: Secondary | ICD-10-CM

## 2013-03-28 HISTORY — DX: Malignant neoplasm of unspecified site of unspecified female breast: C50.919

## 2013-03-29 ENCOUNTER — Other Ambulatory Visit: Payer: Self-pay | Admitting: Family Medicine

## 2013-03-29 DIAGNOSIS — D0512 Intraductal carcinoma in situ of left breast: Secondary | ICD-10-CM

## 2013-04-01 ENCOUNTER — Telehealth: Payer: Self-pay | Admitting: *Deleted

## 2013-04-01 DIAGNOSIS — C50112 Malignant neoplasm of central portion of left female breast: Secondary | ICD-10-CM | POA: Insufficient documentation

## 2013-04-01 NOTE — Telephone Encounter (Signed)
Called and spoke with patient and confirmed [facility] appt for 04/03/13 for 1230.  Instructions and contact information given.  No questions or concerns at this time.

## 2013-04-02 ENCOUNTER — Other Ambulatory Visit: Payer: BC Managed Care – PPO

## 2013-04-03 ENCOUNTER — Ambulatory Visit (HOSPITAL_BASED_OUTPATIENT_CLINIC_OR_DEPARTMENT_OTHER): Payer: BC Managed Care – PPO | Admitting: Oncology

## 2013-04-03 ENCOUNTER — Encounter: Payer: Self-pay | Admitting: Oncology

## 2013-04-03 ENCOUNTER — Encounter: Payer: Self-pay | Admitting: *Deleted

## 2013-04-03 ENCOUNTER — Ambulatory Visit (HOSPITAL_BASED_OUTPATIENT_CLINIC_OR_DEPARTMENT_OTHER): Payer: BC Managed Care – PPO | Admitting: Surgery

## 2013-04-03 ENCOUNTER — Ambulatory Visit
Admission: RE | Admit: 2013-04-03 | Discharge: 2013-04-03 | Disposition: A | Discharge status: Home / Self Care | Payer: BC Managed Care – PPO | Source: Ambulatory Visit | Attending: Radiation Oncology | Admitting: Radiation Oncology

## 2013-04-03 ENCOUNTER — Inpatient Hospital Stay: Admission: RE | Admit: 2013-04-03 | Payer: BC Managed Care – PPO | Source: Ambulatory Visit

## 2013-04-03 ENCOUNTER — Ambulatory Visit
Admission: RE | Admit: 2013-04-03 | Discharge: 2013-04-03 | Disposition: A | Discharge status: Home / Self Care | Payer: BC Managed Care – PPO | Source: Ambulatory Visit | Attending: Family Medicine | Admitting: Family Medicine

## 2013-04-03 ENCOUNTER — Other Ambulatory Visit (HOSPITAL_BASED_OUTPATIENT_CLINIC_OR_DEPARTMENT_OTHER): Payer: BC Managed Care – PPO | Admitting: Lab

## 2013-04-03 ENCOUNTER — Ambulatory Visit: Payer: BC Managed Care – PPO

## 2013-04-03 ENCOUNTER — Ambulatory Visit: Payer: BC Managed Care – PPO | Admitting: Physical Therapy

## 2013-04-03 ENCOUNTER — Encounter (INDEPENDENT_AMBULATORY_CARE_PROVIDER_SITE_OTHER): Payer: Self-pay | Admitting: Surgery

## 2013-04-03 VITALS — BP 156/97 | HR 90 | Temp 99.0°F | Resp 20 | Ht 68.5 in | Wt 232.3 lb

## 2013-04-03 DIAGNOSIS — C50119 Malignant neoplasm of central portion of unspecified female breast: Secondary | ICD-10-CM

## 2013-04-03 DIAGNOSIS — C50112 Malignant neoplasm of central portion of left female breast: Secondary | ICD-10-CM

## 2013-04-03 DIAGNOSIS — C50919 Malignant neoplasm of unspecified site of unspecified female breast: Secondary | ICD-10-CM

## 2013-04-03 DIAGNOSIS — D0512 Intraductal carcinoma in situ of left breast: Secondary | ICD-10-CM

## 2013-04-03 LAB — COMPREHENSIVE METABOLIC PANEL (CC13)
ALT: 12 U/L (ref 0–55)
AST: 12 U/L (ref 5–34)
Chloride: 107 mEq/L (ref 98–109)
Creatinine: 1.2 mg/dL — ABNORMAL HIGH (ref 0.6–1.1)
Total Bilirubin: 0.54 mg/dL (ref 0.20–1.20)

## 2013-04-03 LAB — CBC WITH DIFFERENTIAL/PLATELET
BASO%: 0.7 % (ref 0.0–2.0)
EOS%: 0.9 % (ref 0.0–7.0)
HCT: 34.4 % — ABNORMAL LOW (ref 34.8–46.6)
LYMPH%: 28.3 % (ref 14.0–49.7)
MCH: 29.1 pg (ref 25.1–34.0)
MCHC: 33 g/dL (ref 31.5–36.0)
NEUT%: 60.3 % (ref 38.4–76.8)
lymph#: 2.2 10*3/uL (ref 0.9–3.3)

## 2013-04-03 NOTE — Progress Notes (Signed)
Patient ID: Erica Roberts, female   DOB: Nov 10, 1970, 42 y.o.   MRN: [id number]  No chief complaint on file.   HPI Erica Roberts is a 42 y.o. female.  Pt sent at request of Dr Manson Passey for left breast DCIS on core biopsy 1 cm in size 12 oclock.  No breast mass,  Pain or drainage.  No history of breast cancer. HPI  Past Medical History  Diagnosis Date  . Hypertension   . Heartburn   . Anemia     Past Surgical History  Procedure Laterality Date  . Mouth surgery    . Cesarean section  1991  . Laparoscopic hysterectomy N/A 03/04/2013    Procedure: HYSTERECTOMY TOTAL LAPAROSCOPIC;  Surgeon: Purcell Nails, MD;  Location: [facility];  Service: Gynecology;  Laterality: N/A;  TLH possible LAVH possiblel TAH, Cystoscopy  . Cystoscopy Bilateral 03/04/2013    Procedure: CYSTOSCOPY;  Surgeon: Purcell Nails, MD;  Location: [facility];  Service: Gynecology;  Laterality: Bilateral;    History reviewed. No pertinent family history.  Social History History  Substance Use Topics  . Smoking status: Never Smoker   . Smokeless tobacco: Not on file  . Alcohol Use: Yes     Comment: occasionally    No Known Allergies  Current Outpatient Prescriptions  Medication Sig Dispense Refill  . ibuprofen (ADVIL,MOTRIN) 600 MG tablet 1 po pc every 6 hours for 5 days then prn-pain  30 tablet  1  . olmesartan-hydrochlorothiazide (BENICAR HCT) 40-25 MG per tablet Take 1 tablet by mouth daily.      . ondansetron (ZOFRAN) 4 MG tablet Take 1 tablet (4 mg total) by mouth every 8 (eight) hours as needed for nausea.  20 tablet  0  . oxyCODONE-acetaminophen (PERCOCET/ROXICET) 5-325 MG per tablet Take 1-2 tablets by mouth every 4 (four) hours as needed.  30 tablet  0   No current facility-administered medications for this visit.    Review of Systems Review of Systems  Constitutional: Negative for fever, chills and unexpected weight change.  HENT: Negative for hearing loss, congestion, sore throat, trouble  swallowing and voice change.   Eyes: Negative for visual disturbance.  Respiratory: Negative for cough and wheezing.   Cardiovascular: Negative for chest pain, palpitations and leg swelling.  Gastrointestinal: Negative for nausea, vomiting, abdominal pain, diarrhea, constipation, blood in stool, abdominal distention and anal bleeding.  Genitourinary: Negative for hematuria, vaginal bleeding and difficulty urinating.  Musculoskeletal: Negative for arthralgias.  Skin: Negative for rash and wound.  Neurological: Negative for seizures, syncope and headaches.  Hematological: Negative for adenopathy. Does not bruise/bleed easily.  Psychiatric/Behavioral: Negative for confusion.    There were no vitals taken for this visit.  Physical Exam Physical Exam  Constitutional: She is oriented to person, place, and time. She appears well-developed and well-nourished.  HENT:  Head: Normocephalic and atraumatic.  Eyes: EOM are normal. Pupils are equal, round, and reactive to light.  Neck: Normal range of motion. Neck supple.  Cardiovascular: Normal rate and regular rhythm.   Pulmonary/Chest: Right breast exhibits no inverted nipple, no mass, no nipple discharge, no skin change and no tenderness. Left breast exhibits no inverted nipple, no mass, no nipple discharge, no skin change and no tenderness. Breasts are symmetrical.  Musculoskeletal: Normal range of motion.  Lymphadenopathy:    She has no cervical adenopathy.    She has no axillary adenopathy.  Neurological: She is alert and oriented to person, place, and time.  Skin: Skin is warm  and dry.    Data Reviewed Clinical Data: The patient was recalled from screening for left  breast mass  DIGITAL DIAGNOSTIC LEFT MAMMOGRAM AND LEFT BREAST ULTRASOUND:  Comparison: Prior examination dated 02/27/2013  Findings:  ACR Breast Density Category c: The breast tissue is  heterogeneously dense, which may obscure small masses.  Within the left breast 12  o'clock position anterior depth there is  a 1 cm irregular mass which persists on additional spot compression  views.  On physical exam, I palpate no discrete mass within the 12 o'clock  position left breast.  Ultrasound is performed, showing a 0.9 x 0.7 x 0.8 cm irregular  hypoechoic mass with a surrounding echogenic rim. This is located  within the left breast 12 o'clock position 2 cm from the nipple  No left axillary adenopathy.  IMPRESSION:  Suspicious irregular hypoechoic mass within the left breast.  Further evaluation with ultrasound-guided core needle biopsy is  recommended. This has been scheduled for 9/10/20014 at 8 am.  RECOMMENDATION:  Ultrasound-guided core needle biopsy of suspicious left breast mass  (scheduled).  I have discussed the findings and recommendations with the patient.  Results were also provided in writing at the conclusion of the  visit. If applicable, a reminder letter will be sent to the  patient regarding the next appointment.  BI-RADS CATEGORY 4: Suspicious abnormality - biopsy should be  considered.    Assessment    LEFT BREAST DCIS 1 CM central breast    Plan    Pt desires breast conservation.  Will also need genetics.  Will schedule for left breast lumpectomy needle localized. Oncology and radiation therapy to see.  The procedure has been discussed with the patient. Alternatives to surgery have been discussed with the patient.  Risks of surgery include bleeding,  Infection,  Seroma formation, death,  and the need for further surgery.   The patient understands and wishes to proceed.

## 2013-04-03 NOTE — Patient Instructions (Signed)
Lumpectomy, Breast Conserving Surgery A lumpectomy is breast surgery that removes only part of the breast. Another name used may be partial mastectomy. The amount removed varies. Make sure you understand how much of your breast will be removed. Reasons for a lumpectomy:  Any solid breast mass.  Grouped significant nodularity that may be confused with a solitary breast mass. Lumpectomy is the most common form of breast cancer surgery today. The surgeon removes the portion of your breast which contains the tumor (cancer). This is the lump. Some normal tissue around the lump is also removed to be sure that all the tumor has been removed.  If cancer cells are found in the margins where the breast tissue was removed, your surgeon will do more surgery to remove the remaining cancer tissue. This is called re-excision surgery. Radiation and/or chemotherapy treatments are often given following a lumpectomy to kill any cancer cells that could possibly remain.  REASONS YOU MAY NOT BE ABLE TO HAVE BREAST CONSERVING SURGERY:  The tumor is located in more than one place.  Your breast is small and the tumor is large so the breast would be disfigured.  The entire tumor removal is not successful with a lumpectomy.  You cannot commit to a full course of chemotherapy, radiation therapy or are pregnant and cannot have radiation.  You have previously had radiation to the breast to treat cancer. HOW A LUMPECTOMY IS PERFORMED If overnight nursing is not required following a biopsy, a lumpectomy can be performed as a same-day surgery. This can be done in a hospital, clinic, or surgical center. The anesthesia used will depend on your surgeon. They will discuss this with you. A general anesthetic keeps you sleeping through the procedure. LET YOUR CAREGIVERS KNOW ABOUT THE FOLLOWING:  Allergies  Medications taken including herbs, eye drops, over the counter medications, and creams.  Use of steroids (by mouth or  creams)  Previous problems with anesthetics or Novocaine.  Possibility of pregnancy, if this applies  History of blood clots (thrombophlebitis)  History of bleeding or blood problems.  Previous surgery  Other health problems BEFORE THE PROCEDURE You should be present one hour prior to your procedure unless directed otherwise.  AFTER THE PROCEDURE  After surgery, you will be taken to the recovery area where a nurse will watch and check your progress. Once you're awake, stable, and taking fluids well, barring other problems you will be allowed to go home.  Ice packs applied to your operative site may help with discomfort and keep the swelling down.  A small rubber drain may be placed in the breast for a couple of days to prevent a hematoma from developing in the breast.  A pressure dressing may be applied for 24 to 48 hours to prevent bleeding.  Keep the wound dry.  You may resume a normal diet and activities as directed. Avoid strenuous activities affecting the arm on the side of the biopsy site such as tennis, swimming, heavy lifting (more than 10 pounds) or pulling.  Bruising in the breast is normal following this procedure.  Wearing a bra - even to bed - may be more comfortable and also help keep the dressing on.  Change dressings as directed.  Only take over-the-counter or prescription medicines for pain, discomfort, or fever as directed by your caregiver. Call for your results as instructed by your surgeon. Remember it is your responsibility to get the results of your lumpectomy if your surgeon asked you to follow-up. Do not assume   everything is fine if you have not heard from your caregiver. SEEK MEDICAL CARE IF:   There is increased bleeding (more than a small spot) from the wound.  You notice redness, swelling, or increasing pain in the wound.  Pus is coming from wound.  An unexplained oral temperature above 102 F (38.9 C) develops.  You notice a foul smell  coming from the wound or dressing. SEEK IMMEDIATE MEDICAL CARE IF:   You develop a rash.  You have difficulty breathing.  You have any allergic problems. Document Released: 08/22/2006 Document Revised: 10/03/2011 Document Reviewed: 11/23/2006 Exit[employer] Patient Information 2014 [employer].  

## 2013-04-03 NOTE — Progress Notes (Signed)
Checked in new pt with no financial concerns. °

## 2013-04-03 NOTE — Progress Notes (Signed)
Erica Roberts [id number] [birth year] 42 y.o. 04/03/2013 4:13 PM  CC  Altamese Winkelman, MD [address] [place] [address] Dr. Harriette Bouillon Dr. Dorothy Puffer  REASON FOR CONSULTATION:  42 year old female with new diagnosis of DCIS of the left breast. Patient is seen in the multidisciplinary breast clinic for discussion of treatment options.   STAGE:   Cancer of central portion of female breast   Primary site: Breast (Left)   Staging method: AJCC 7th Edition   Clinical: Stage 0 (Tis (DCIS), N0, cM0)   Summary: Stage 0 (Tis (DCIS), N0, cM0)  REFERRING PHYSICIAN: Dr. Maisie Fus Cornett  HISTORY OF PRESENT ILLNESS:  JOSSLYN Roberts is a 42 y.o. female.  Weighted medical history significant for hypertension and anemia. Patient underwent a screening mammogram that revealed suspicious findings. Ultrasound showed a 9 mm lesion her in a biopsy was performed this revealed low grade ductal carcinoma in situ. The receptor studies are revealing the tumor to be ER positive PR positive. MRI is pending. Her case was discussed at the multidisciplinary breast clinic and conference. Her pathology and radiology were reviewed. She is without any complaints.   Past Medical History: Past Medical History  Diagnosis Date  . Hypertension   . Heartburn   . Anemia     Past Surgical History: Past Surgical History  Procedure Laterality Date  . Mouth surgery    . Cesarean section  1991  . Laparoscopic hysterectomy N/A 03/04/2013    Procedure: HYSTERECTOMY TOTAL LAPAROSCOPIC;  Surgeon: Purcell Nails, MD;  Location: [facility];  Service: Gynecology;  Laterality: N/A;  TLH possible LAVH possiblel TAH, Cystoscopy  . Cystoscopy Bilateral 03/04/2013    Procedure: CYSTOSCOPY;  Surgeon: Purcell Nails, MD;  Location: [facility];  Service: Gynecology;  Laterality: Bilateral;    Family History: History reviewed. No pertinent family history.  Social History History  Substance Use  Topics  . Smoking status: Never Smoker   . Smokeless tobacco: Not on file  . Alcohol Use: Yes     Comment: occasionally    Allergies: No Known Allergies  Current Medications: Current Outpatient Prescriptions  Medication Sig Dispense Refill  . olmesartan-hydrochlorothiazide (BENICAR HCT) 40-25 MG per tablet Take 1 tablet by mouth daily.      Marland Kitchen ibuprofen (ADVIL,MOTRIN) 600 MG tablet 1 po pc every 6 hours for 5 days then prn-pain  30 tablet  1  . ondansetron (ZOFRAN) 4 MG tablet Take 1 tablet (4 mg total) by mouth every 8 (eight) hours as needed for nausea.  20 tablet  0  . oxyCODONE-acetaminophen (PERCOCET/ROXICET) 5-325 MG per tablet Take 1-2 tablets by mouth every 4 (four) hours as needed.  30 tablet  0   No current facility-administered medications for this visit.    OB/GYN History:menarche at age 37 she is premenopausal first live birth at 73. She has completed her family  Fertility Discussion: she has completed her family Prior History of Cancer: no  Health Maintenance:  Colonoscopy no Bone Density no Last PAP smear up-to-date  ECOG PERFORMANCE STATUS: 0 - Asymptomatic  Genetic Counseling/testing: yes  REVIEW OF SYSTEMS:  A comprehensive review of systems was negative.  PHYSICAL EXAMINATION: Blood pressure 156/97, pulse 90, temperature 99 F (37.2 C), temperature source Oral, resp. rate 20, height 5' 8.5" (1.74 m), weight 232 lb 4.8 oz (105.371 kg).  MWN:UUVOZ, healthy, no distress, well nourished and well developed SKIN: skin color, texture, turgor are normal, no rashes or significant lesions HEAD: Normocephalic EYES:  PERRLA, EOMI, Conjunctiva are pink and non-injected EARS: External ears normal OROPHARYNX:no exudate, no erythema and lips, buccal mucosa, and tongue normal  NECK: no adenopathy, thyroid normal size, non-tender, without nodularity LYMPH:  no palpable lymphadenopathy, no hepatosplenomegaly BREAST:breasts appear normal, no suspicious masses, no skin  or nipple changes or axillary nodes LUNGS: clear to auscultation and percussion HEART: regular rate & rhythm, no murmurs and no gallops ABDOMEN:abdomen soft, non-tender, normal bowel sounds and no masses or organomegaly BACK: Back symmetric, no curvature., No CVA tenderness EXTREMITIES:no edema, no clubbing, no cyanosis  NEURO: alert & oriented x 3 with fluent speech, no focal motor/sensory deficits, gait normal     STUDIES/RESULTS: US Breast Left  03-31-13   *RADIOLOGY REPORT*  Clinical Data:  The patient was recalled from screening for left breast mass  DIGITAL DIAGNOSTIC LEFT MAMMOGRAM  AND LEFT BREAST ULTRASOUND:  Comparison:  Prior examination dated 02/27/2013  Findings:  ACR Breast Density Category c:  The breast tissue is heterogeneously dense, which may obscure small masses.  Within the left breast 12 o'clock position anterior depth there is a 1 cm irregular mass which persists on additional spot compression views.  On physical exam, I palpate no discrete mass within the 12 o'clock position left breast.  Ultrasound is performed, showing a 0.9 x 0.7 x 0.8 cm irregular hypoechoic mass with a surrounding echogenic rim.  This is located within the left breast 12 o'clock position 2 cm from the nipple  No left axillary adenopathy.  IMPRESSION:  Suspicious irregular hypoechoic mass within the left breast. Further evaluation with ultrasound-guided core needle biopsy is recommended.  This has been scheduled for 9/10/20014 at 8 am.  RECOMMENDATION: Ultrasound-guided core needle biopsy of suspicious left breast mass (scheduled).  I have discussed the findings and recommendations with the patient. Results were also provided in writing at the conclusion of the visit.  If applicable, a reminder letter will be sent to the patient regarding the next appointment.  BI-RADS CATEGORY 4:  Suspicious abnormality - biopsy should be considered.   Original Report Authenticated By: Annia Belt, M.D   [facility]  2013/03/31   *RADIOLOGY REPORT*  Clinical Data:  The patient was recalled from screening for left breast mass  DIGITAL DIAGNOSTIC LEFT MAMMOGRAM  AND LEFT BREAST ULTRASOUND:  Comparison:  Prior examination dated 02/27/2013  Findings:  ACR Breast Density Category c:  The breast tissue is heterogeneously dense, which may obscure small masses.  Within the left breast 12 o'clock position anterior depth there is a 1 cm irregular mass which persists on additional spot compression views.  On physical exam, I palpate no discrete mass within the 12 o'clock position left breast.  Ultrasound is performed, showing a 0.9 x 0.7 x 0.8 cm irregular hypoechoic mass with a surrounding echogenic rim.  This is located within the left breast 12 o'clock position 2 cm from the nipple  No left axillary adenopathy.  IMPRESSION:  Suspicious irregular hypoechoic mass within the left breast. Further evaluation with ultrasound-guided core needle biopsy is recommended.  This has been scheduled for 9/10/20014 at 8 am.  RECOMMENDATION: Ultrasound-guided core needle biopsy of suspicious left breast mass (scheduled).  I have discussed the findings and recommendations with the patient. Results were also provided in writing at the conclusion of the visit.  If applicable, a reminder letter will be sent to the patient regarding the next appointment.  BI-RADS CATEGORY 4:  Suspicious abnormality - biopsy should be considered.  Original Report Authenticated By: Annia Belt, M.D   [facility]  03/28/2013   *RADIOLOGY REPORT*  Clinical Data:  Status post ultrasound guided core biopsy of mass in the subareolar region of the left breast.  DIGITAL DIAGNOSTIC LEFT MAMMOGRAM  Comparison:  Previous exams.  Findings:  Mammographic images were obtained following ultrasound guided biopsy of mass in the subareolar region of the left breast. A ribbon shaped clip is identified in the suspicious mass.  IMPRESSION: Tissue marker clip is in  expected location after biopsy.  Final Assessment:  Post Procedure Mammograms for Marker Placement   Original Report Authenticated By: Norva Pavlov, M.D.   [place] Lt Breast Bx W Loc Dev 1st Lesion Img Bx Spec US Guide  03/29/2013   **ADDENDUM** CREATED: 03/29/2013 13:04:23  Pathology revealed ductal carcinoma in situ.  This is found to be concordant with imaging findings.  I discussed the results over the phone with the patient and answered her questions.  The patient states she is doing well post biopsy without complications.  Recommendations:  MRI of the breasts scheduled for April 03, 2013 8:00 a.m.  [facility] clinic on schedule for April 03, 2013.  **END ADDENDUM** SIGNED BY: Sherian Rein, M.D.  03/28/2013   *RADIOLOGY REPORT*  Clinical Data:  The patient returns for ultrasound-guided core biopsy of the left breast.  ULTRASOUND GUIDED VACUUM ASSISTED CORE BIOPSY OF THE LEFT BREAST  Comparison: Previous exams.  I met with the patient and we discussed the procedure of ultrasound- guided biopsy, including benefits and alternatives.  We discussed the high likelihood of a successful procedure. We discussed the risks of the procedure including infection, bleeding, tissue injury, clip migration, and inadequate sampling.  Informed written consent was given. The usual time-out protocol was performed immediately prior to the procedure.  Using sterile technique and 2% Lidocaine as local anesthetic, under direct ultrasound visualization, a 12 gauge vacuum-assisted device was used to perform biopsy of lesion in the subareolar region of the left breast using a lateral approach. At the conclusion of the procedure, a ribbon shaped tissue marker clip was deployed into the biopsy cavity.  Follow-up 2-view mammogram was performed and dictated separately.  IMPRESSION: Ultrasound-guided biopsy of left breast mass.  No apparent complications.   Original Report Authenticated By: Norva Pavlov, M.D.     LABS:    Chemistry       Component Value Date/Time   NA 142 04/03/2013 1230   NA 142 02/28/2013 1020   K 4.3 04/03/2013 1230   K 4.1 02/28/2013 1020   CL 106 02/28/2013 1020   CO2 29 04/03/2013 1230   CO2 26 02/28/2013 1020   BUN 12.3 04/03/2013 1230   BUN 14 02/28/2013 1020   CREATININE 1.2* 04/03/2013 1230   CREATININE 0.84 02/28/2013 1020      Component Value Date/Time   CALCIUM 9.7 04/03/2013 1230   CALCIUM 10.2 02/28/2013 1020   ALKPHOS 73 04/03/2013 1230   AST 12 04/03/2013 1230   ALT 12 04/03/2013 1230   BILITOT 0.54 04/03/2013 1230      Lab Results  Component Value Date   WBC 7.6 04/03/2013   HGB 11.4* 04/03/2013   HCT 34.4* 04/03/2013   MCV 88.2 04/03/2013   PLT 243 04/03/2013   PATHOLOGY: ADDITIONAL INFORMATION: PROGNOSTIC INDICATORS - ACIS Results: IMMUNOHISTOCHEMICAL AND MORPHOMETRIC ANALYSIS BY THE AUTOMATED CELLULAR IMAGING SYSTEM (ACIS) Estrogen Receptor: 78%, POSITIVE, STRONG STAINING INTENSITY Progesterone Receptor: 80%, POSITIVE, STRONG STAINING INTENSITY REFERENCE RANGE ESTROGEN RECEPTOR  NEGATIVE <1% POSITIVE =>1% PROGESTERONE RECEPTOR NEGATIVE <1% POSITIVE =>1% All controls stained appropriately Pecola Leisure MD Pathologist, Electronic Signature ( Signed 04/03/2013) FINAL DIAGNOSIS Diagnosis Breast, left, needle core biopsy, mass, subareolar - DUCTAL CARCINOMA IN SITU. - SEE COMMENT. 1 of 2 FINAL for STEFANNIE, DEFEO ([id number]) Microscopic Comment The carcinoma appears low to intermediate grade. Estrogen receptor and progesterone receptor studies will be performed and the results reported separately. The results were called to the [facility] on 03/29/2013. (JBK:kh 03/29/13) Pecola Leisure MD Pathologist, Electronic Signature (Case signed 03/29/2013) Specimen Gross and Clinica ASSESSMENT    42 year old female with  #1 new diagnosis of low-grade ductal carcinoma in situ of the left breast the tumor was ER positive PR positive. Patient is seen in the  multidisciplinary breast clinic for discussion of treatment options. She is clinically stage 0. Recommendation is lumpectomy with sentinel lymph node biopsy.  #2 I discussed with her the pathophysiology of breast cancer risks and benefits a different approaches of treatment. We also discussed adjuvant systemic treatment for future breast cancer prevention with tamoxifen 20 mg daily. This certainly will start after her surgery and radiation  Clinical Trial Eligibility:in NSABP B. 43 Multidisciplinary conference discussionyes     PLAN:    #1 proceed with lumpectomy by Dr. Harriette Bouillon.  #2 I will see the patient back after her surgery. We will also at that time discussed the clinical study.        Discussion: Patient is being treated per NCCN breast cancer care guidelines appropriate for stage.0   Thank you so much for allowing me to participate in the care of [employer]. I will continue to follow up the patient with you and assist in her care.  All questions were answered. The patient knows to call the clinic with any problems, questions or concerns. We can certainly see the patient much sooner if necessary.  I spent 40 minutes counseling the patient face to face. The total time spent in the appointment was 60 minutes.   Drue Second, MD Medical/Oncology [facility] [phone] (beeper) [phone] (Office)  04/03/2013, 4:13 PM

## 2013-04-04 ENCOUNTER — Telehealth: Payer: Self-pay | Admitting: Oncology

## 2013-04-04 NOTE — Telephone Encounter (Signed)
, °

## 2013-04-07 NOTE — Progress Notes (Signed)
Radiation Oncology         [phone]) [phone] ________________________________  Name: Erica Roberts MRN: [id number]  Date: 04/03/2013  DOB: 10/26/1970  NW:GNFAOZ,HYQMV D, MD  Cornett, Clovis Pu., MD     Drue Second, MD  REFERRING PHYSICIAN: Luisa Hart, Clovis Pu., MD   DIAGNOSIS: The encounter diagnosis was Cancer of central portion of female breast, left.   HISTORY OF PRESENT ILLNESS::Erica Roberts is a 42 y.o. female who is seen for an initial consultation visit. The patient was found to have suspicious findings within the left breast on recent screening mammogram. The patient states that this was her first mammogram. Ultrasound was performed and this revealed a 9 mm lesion and biopsy was therefore performed. 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 Receptor studies have been completed and they have demonstrated that the tumor is ER positive and PR positive. An MRI scan of the breasts bilaterally is pending.   PREVIOUS RADIATION THERAPY: No   PAST MEDICAL HISTORY:  has a past medical history of Hypertension; Heartburn; and Anemia.     PAST SURGICAL HISTORY: Past Surgical History  Procedure Laterality Date  . Mouth surgery    . Cesarean section  1991  . Laparoscopic hysterectomy N/A 03/04/2013    Procedure: HYSTERECTOMY TOTAL LAPAROSCOPIC;  Surgeon: Purcell Nails, MD;  Location: [facility];  Service: Gynecology;  Laterality: N/A;  TLH possible LAVH possiblel TAH, Cystoscopy  . Cystoscopy Bilateral 03/04/2013    Procedure: CYSTOSCOPY;  Surgeon: Purcell Nails, MD;  Location: [facility];  Service: Gynecology;  Laterality: Bilateral;     FAMILY HISTORY: family history is not on file.   SOCIAL HISTORY:  reports that she has never smoked. She does not have any smokeless tobacco history on file. She reports that  drinks alcohol. She reports that she does not use illicit drugs.   ALLERGIES: Review of patient's allergies indicates no known allergies.   MEDICATIONS:    Current Outpatient Prescriptions  Medication Sig Dispense Refill  . ibuprofen (ADVIL,MOTRIN) 600 MG tablet 1 po pc every 6 hours for 5 days then prn-pain  30 tablet  1  . olmesartan-hydrochlorothiazide (BENICAR HCT) 40-25 MG per tablet Take 1 tablet by mouth daily.      . ondansetron (ZOFRAN) 4 MG tablet Take 1 tablet (4 mg total) by mouth every 8 (eight) hours as needed for nausea.  20 tablet  0  . oxyCODONE-acetaminophen (PERCOCET/ROXICET) 5-325 MG per tablet Take 1-2 tablets by mouth every 4 (four) hours as needed.  30 tablet  0   No current facility-administered medications for this encounter.     REVIEW OF SYSTEMS:  A 15 point review of systems is documented in the electronic medical record. This was obtained by the nursing staff. However, I reviewed this with the patient to discuss relevant findings and make appropriate changes.  Pertinent items are noted in HPI.    PHYSICAL EXAM:  vitals were not taken for this visit.  General: Well-developed, in no acute distress HEENT: Normocephalic, atraumatic; oral cavity clear Neck: Supple without any lymphadenopathy Cardiovascular: Regular rate and rhythm Respiratory: Clear to auscultation bilaterally Breasts:  A small biopsy site is present. I cannot palpate any clear nodule on exam. No skin change or nipple changes. Unremarkable exam on the right. No axillary adenopathy on either side. GI: Soft, nontender, normal bowel sounds Extremities: No edema present Neuro: No focal deficits     LABORATORY DATA:  Lab Results  Component Value Date   WBC  7.6 04/03/2013   HGB 11.4* 04/03/2013   HCT 34.4* 04/03/2013   MCV 88.2 04/03/2013   PLT 243 04/03/2013   Lab Results  Component Value Date   NA 142 04/03/2013   K 4.3 04/03/2013   CL 106 02/28/2013   CO2 29 04/03/2013   Lab Results  Component Value Date   ALT 12 04/03/2013   AST 12 04/03/2013   ALKPHOS 73 04/03/2013   BILITOT 0.54 04/03/2013      RADIOGRAPHY: US Breast Left  03/18/2013    *RADIOLOGY REPORT*  Clinical Data:  The patient was recalled from screening for left breast mass  DIGITAL DIAGNOSTIC LEFT MAMMOGRAM  AND LEFT BREAST ULTRASOUND:  Comparison:  Prior examination dated 02/27/2013  Findings:  ACR Breast Density Category c:  The breast tissue is heterogeneously dense, which may obscure small masses.  Within the left breast 12 o'clock position anterior depth there is a 1 cm irregular mass which persists on additional spot compression views.  On physical exam, I palpate no discrete mass within the 12 o'clock position left breast.  Ultrasound is performed, showing a 0.9 x 0.7 x 0.8 cm irregular hypoechoic mass with a surrounding echogenic rim.  This is located within the left breast 12 o'clock position 2 cm from the nipple  No left axillary adenopathy.  IMPRESSION:  Suspicious irregular hypoechoic mass within the left breast. Further evaluation with ultrasound-guided core needle biopsy is recommended.  This has been scheduled for 9/10/20014 at 8 am.  RECOMMENDATION: Ultrasound-guided core needle biopsy of suspicious left breast mass (scheduled).  I have discussed the findings and recommendations with the patient. Results were also provided in writing at the conclusion of the visit.  If applicable, a reminder letter will be sent to the patient regarding the next appointment.  BI-RADS CATEGORY 4:  Suspicious abnormality - biopsy should be considered.   Original Report Authenticated By: Annia Belt, M.D   [facility]  03/18/2013   *RADIOLOGY REPORT*  Clinical Data:  The patient was recalled from screening for left breast mass  DIGITAL DIAGNOSTIC LEFT MAMMOGRAM  AND LEFT BREAST ULTRASOUND:  Comparison:  Prior examination dated 02/27/2013  Findings:  ACR Breast Density Category c:  The breast tissue is heterogeneously dense, which may obscure small masses.  Within the left breast 12 o'clock position anterior depth there is a 1 cm irregular mass which persists on additional spot  compression views.  On physical exam, I palpate no discrete mass within the 12 o'clock position left breast.  Ultrasound is performed, showing a 0.9 x 0.7 x 0.8 cm irregular hypoechoic mass with a surrounding echogenic rim.  This is located within the left breast 12 o'clock position 2 cm from the nipple  No left axillary adenopathy.  IMPRESSION:  Suspicious irregular hypoechoic mass within the left breast. Further evaluation with ultrasound-guided core needle biopsy is recommended.  This has been scheduled for 9/10/20014 at 8 am.  RECOMMENDATION: Ultrasound-guided core needle biopsy of suspicious left breast mass (scheduled).  I have discussed the findings and recommendations with the patient. Results were also provided in writing at the conclusion of the visit.  If applicable, a reminder letter will be sent to the patient regarding the next appointment.  BI-RADS CATEGORY 4:  Suspicious abnormality - biopsy should be considered.   Original Report Authenticated By: Annia Belt, M.D   [facility]  03/28/2013   *RADIOLOGY REPORT*  Clinical Data:  Status post ultrasound guided core biopsy of  mass in the subareolar region of the left breast.  DIGITAL DIAGNOSTIC LEFT MAMMOGRAM  Comparison:  Previous exams.  Findings:  Mammographic images were obtained following ultrasound guided biopsy of mass in the subareolar region of the left breast. A ribbon shaped clip is identified in the suspicious mass.  IMPRESSION: Tissue marker clip is in expected location after biopsy.  Final Assessment:  Post Procedure Mammograms for Marker Placement   Original Report Authenticated By: Norva Pavlov, M.D.   [place] Lt Breast Bx W Loc Dev 1st Lesion Img Bx Spec US Guide  03/29/2013   **ADDENDUM** CREATED: 03/29/2013 13:04:23  Pathology revealed ductal carcinoma in situ.  This is found to be concordant with imaging findings.  I discussed the results over the phone with the patient and answered her questions.  The patient states  she is doing well post biopsy without complications.  Recommendations:  MRI of the breasts scheduled for April 03, 2013 8:00 a.m.  [facility] clinic on schedule for April 03, 2013.  **END ADDENDUM** SIGNED BY: Sherian Rein, M.D.  03/28/2013   *RADIOLOGY REPORT*  Clinical Data:  The patient returns for ultrasound-guided core biopsy of the left breast.  ULTRASOUND GUIDED VACUUM ASSISTED CORE BIOPSY OF THE LEFT BREAST  Comparison: Previous exams.  I met with the patient and we discussed the procedure of ultrasound- guided biopsy, including benefits and alternatives.  We discussed the high likelihood of a successful procedure. We discussed the risks of the procedure including infection, bleeding, tissue injury, clip migration, and inadequate sampling.  Informed written consent was given. The usual time-out protocol was performed immediately prior to the procedure.  Using sterile technique and 2% Lidocaine as local anesthetic, under direct ultrasound visualization, a 12 gauge vacuum-assisted device was used to perform biopsy of lesion in the subareolar region of the left breast using a lateral approach. At the conclusion of the procedure, a ribbon shaped tissue marker clip was deployed into the biopsy cavity.  Follow-up 2-view mammogram was performed and dictated separately.  IMPRESSION: Ultrasound-guided biopsy of left breast mass.  No apparent complications.   Original Report Authenticated By: Norva Pavlov, M.D.       IMPRESSION: The patient is a good breast conservation treatment candidate. She was seen today in multidisciplinary clinic and has a finding of ductal carcinoma in situ of the left breast. The patient has been seen by Dr. Luisa Hart today who has discussed with the patient proceeding with a lumpectomy in the near future.  I discussed with the patient the potential role of radiotherapy in her setting to improve local/regional control. I discussed with the patient therefore the expected and of a  course of radiotherapy which would correspond to a 6-1/2 week course of treatment. I also discussed with the patient the potential side effects and risks of treatment. All of her foot were answered and she does wish to proceed with this overall treatment plan.   PLAN: The patient will proceed with a lumpectomy in the near future. I look forward to seeing her postoperatively to evaluate her at that time in terms of her recovery from surgery and to coordinate adjuvant radiotherapy.    I spent 60  minutes face to face with the patient and more than 50% of that time was spent in counseling and/or coordination of care.    ________________________________   Radene Gunning, MD, PhD

## 2013-04-08 ENCOUNTER — Ambulatory Visit
Admission: RE | Admit: 2013-04-08 | Discharge: 2013-04-08 | Disposition: A | Discharge status: Home / Self Care | Payer: BC Managed Care – PPO | Source: Ambulatory Visit | Attending: Family Medicine | Admitting: Family Medicine

## 2013-04-09 ENCOUNTER — Telehealth: Payer: Self-pay | Admitting: *Deleted

## 2013-04-09 ENCOUNTER — Telehealth (INDEPENDENT_AMBULATORY_CARE_PROVIDER_SITE_OTHER): Payer: Self-pay | Admitting: *Deleted

## 2013-04-09 ENCOUNTER — Other Ambulatory Visit (INDEPENDENT_AMBULATORY_CARE_PROVIDER_SITE_OTHER): Payer: Self-pay | Admitting: Surgery

## 2013-04-09 NOTE — Telephone Encounter (Signed)
Spoke to [place] They see orders in the computer and will get patient scheduled.

## 2013-04-09 NOTE — Telephone Encounter (Signed)
Orders already in computer

## 2013-04-09 NOTE — Telephone Encounter (Signed)
[facility] to see if we could get orders entered on this patient.  Last night states that it is fine to move ahead.  Is there anything that needs to be done or can orders just be placed.  Message being sent to Dr. Luisa Hart for his review and decision.

## 2013-04-09 NOTE — Telephone Encounter (Signed)
Spoke to pt concerning [facility] from 04/03/13.  Pt denies questions or concerns regarding dx or treatment care plan.  Pt asked when surgery would be scheduled.  This RN called [facility].  Request orders to be placed for surgery to be scheduled.  Encourage pt to call with needs.  Received verbal understanding.  Contact information given.

## 2013-04-15 ENCOUNTER — Encounter: Payer: Self-pay | Admitting: Specialist

## 2013-04-15 NOTE — Progress Notes (Signed)
I contacted Erica Roberts at the request of Erica Roberts, physical therapist.  The secretary at the rehab clinic had reported that Erica Roberts seemed to be having a difficult time dealing with her cancer diagnosis and suggested she needed some support.  When I talked with the patient, she inquired about support groups.  I gave her the information, although she cannot come to groups until October.  She told me her surgery is scheduled for October 9.  She was interested in counseling, so I will ask a counseling intern to contact her.  She also asked that I make a referral for her to [facility].  I gave her my contact information, in the event I can be of additional assistance to her.

## 2013-04-16 ENCOUNTER — Encounter: Payer: Self-pay | Admitting: *Deleted

## 2013-04-17 ENCOUNTER — Ambulatory Visit
Admission: RE | Admit: 2013-04-17 | Discharge: 2013-04-17 | Disposition: A | Discharge status: Home / Self Care | Payer: BC Managed Care – PPO | Source: Ambulatory Visit | Attending: Family Medicine | Admitting: Family Medicine

## 2013-04-17 MED ORDER — GADOBENATE DIMEGLUMINE 529 MG/ML IV SOLN
20.0000 mL | Freq: Once | INTRAVENOUS | Status: AC | PRN
  Administered 2013-04-17: 20 mL via INTRAVENOUS

## 2013-04-19 ENCOUNTER — Telehealth: Payer: Self-pay | Admitting: Oncology

## 2013-04-19 NOTE — Telephone Encounter (Signed)
lmonvm advising the pt of her cancelled appt on 05/03/2013 and moved to 05/15/2013.

## 2013-04-22 ENCOUNTER — Encounter (INDEPENDENT_AMBULATORY_CARE_PROVIDER_SITE_OTHER): Payer: Self-pay

## 2013-04-25 ENCOUNTER — Encounter (HOSPITAL_BASED_OUTPATIENT_CLINIC_OR_DEPARTMENT_OTHER): Payer: Self-pay | Admitting: *Deleted

## 2013-04-25 NOTE — Progress Notes (Signed)
Had hyst 8/14-ekg done-labs cc 04/03/13-[facility] cmet

## 2013-05-02 ENCOUNTER — Encounter (HOSPITAL_BASED_OUTPATIENT_CLINIC_OR_DEPARTMENT_OTHER): Payer: BC Managed Care – PPO | Admitting: Anesthesiology

## 2013-05-02 ENCOUNTER — Ambulatory Visit (HOSPITAL_BASED_OUTPATIENT_CLINIC_OR_DEPARTMENT_OTHER)
Admission: RE | Admit: 2013-05-02 | Discharge: 2013-05-02 | Disposition: A | Discharge status: Home / Self Care | Payer: BC Managed Care – PPO | Source: Ambulatory Visit | Attending: Surgery | Admitting: Surgery

## 2013-05-02 ENCOUNTER — Encounter (HOSPITAL_BASED_OUTPATIENT_CLINIC_OR_DEPARTMENT_OTHER): Payer: Self-pay | Admitting: Anesthesiology

## 2013-05-02 ENCOUNTER — Encounter (HOSPITAL_BASED_OUTPATIENT_CLINIC_OR_DEPARTMENT_OTHER)
Admission: RE | Disposition: A | Discharge status: Home / Self Care | Payer: Self-pay | Source: Ambulatory Visit | Attending: Surgery

## 2013-05-02 ENCOUNTER — Ambulatory Visit
Admission: RE | Admit: 2013-05-02 | Discharge: 2013-05-02 | Disposition: A | Discharge status: Home / Self Care | Payer: BC Managed Care – PPO | Source: Ambulatory Visit | Attending: Surgery | Admitting: Surgery

## 2013-05-02 ENCOUNTER — Ambulatory Visit (HOSPITAL_BASED_OUTPATIENT_CLINIC_OR_DEPARTMENT_OTHER): Payer: BC Managed Care – PPO | Admitting: Anesthesiology

## 2013-05-02 DIAGNOSIS — I10 Essential (primary) hypertension: Secondary | ICD-10-CM | POA: Insufficient documentation

## 2013-05-02 DIAGNOSIS — D0590 Unspecified type of carcinoma in situ of unspecified breast: Secondary | ICD-10-CM

## 2013-05-02 DIAGNOSIS — D249 Benign neoplasm of unspecified breast: Secondary | ICD-10-CM | POA: Insufficient documentation

## 2013-05-02 DIAGNOSIS — D0512 Intraductal carcinoma in situ of left breast: Secondary | ICD-10-CM

## 2013-05-02 HISTORY — DX: Presence of spectacles and contact lenses: Z97.3

## 2013-05-02 HISTORY — PX: BREAST LUMPECTOMY WITH NEEDLE LOCALIZATION: SHX5759

## 2013-05-02 HISTORY — PX: BREAST LUMPECTOMY: SHX2

## 2013-05-02 SURGERY — BREAST LUMPECTOMY WITH NEEDLE LOCALIZATION
Anesthesia: General | Site: Breast | Laterality: Left | Wound class: Clean

## 2013-05-02 MED ORDER — MIDAZOLAM HCL 2 MG/2ML IJ SOLN: 1.0000 mg | INTRAMUSCULAR | Status: DC | PRN

## 2013-05-02 MED ORDER — ONDANSETRON HCL 4 MG/2ML IJ SOLN
INTRAMUSCULAR | Status: DC | PRN
  Administered 2013-05-02: 4 mg via INTRAMUSCULAR

## 2013-05-02 MED ORDER — DEXAMETHASONE SODIUM PHOSPHATE 4 MG/ML IJ SOLN
INTRAMUSCULAR | Status: DC | PRN
  Administered 2013-05-02: 10 mg via INTRAVENOUS

## 2013-05-02 MED ORDER — HYDROMORPHONE HCL PF 1 MG/ML IJ SOLN
0.2500 mg | INTRAMUSCULAR | Status: DC | PRN
  Administered 2013-05-02 (×2): 0.5 mg via INTRAVENOUS

## 2013-05-02 MED ORDER — CHLORHEXIDINE GLUCONATE 4 % EX LIQD: 1.0000 "application " | Freq: Once | CUTANEOUS | Status: DC

## 2013-05-02 MED ORDER — METOCLOPRAMIDE HCL 5 MG/ML IJ SOLN: 10.0000 mg | Freq: Once | INTRAMUSCULAR | Status: DC | PRN

## 2013-05-02 MED ORDER — OXYCODONE HCL 5 MG/5ML PO SOLN: 5.0000 mg | Freq: Once | ORAL | Status: AC | PRN

## 2013-05-02 MED ORDER — FENTANYL CITRATE 0.05 MG/ML IJ SOLN: 50.0000 ug | INTRAMUSCULAR | Status: DC | PRN

## 2013-05-02 MED ORDER — LACTATED RINGERS IV SOLN
INTRAVENOUS | Status: DC
  Administered 2013-05-02 (×3): via INTRAVENOUS

## 2013-05-02 MED ORDER — OXYCODONE-ACETAMINOPHEN 5-325 MG PO TABS: 1.0000 | ORAL_TABLET | ORAL | Status: DC | PRN

## 2013-05-02 MED ORDER — BUPIVACAINE-EPINEPHRINE 0.25% -1:200000 IJ SOLN
INTRAMUSCULAR | Status: DC | PRN
  Administered 2013-05-02: 20 mL

## 2013-05-02 MED ORDER — MIDAZOLAM HCL 5 MG/5ML IJ SOLN
INTRAMUSCULAR | Status: DC | PRN
  Administered 2013-05-02: 2 mg via INTRAVENOUS

## 2013-05-02 MED ORDER — FENTANYL CITRATE 0.05 MG/ML IJ SOLN
INTRAMUSCULAR | Status: DC | PRN
  Administered 2013-05-02: 100 ug via INTRAVENOUS
  Administered 2013-05-02 (×3): 25 ug via INTRAVENOUS

## 2013-05-02 MED ORDER — CEFAZOLIN SODIUM 10 G IJ SOLR
3.0000 g | INTRAMUSCULAR | Status: AC
  Administered 2013-05-02: 2 g via INTRAVENOUS

## 2013-05-02 MED ORDER — OXYCODONE HCL 5 MG PO TABS
5.0000 mg | ORAL_TABLET | Freq: Once | ORAL | Status: AC | PRN
  Administered 2013-05-02: 5 mg via ORAL

## 2013-05-02 MED ORDER — LIDOCAINE HCL (CARDIAC) 20 MG/ML IV SOLN
INTRAVENOUS | Status: DC | PRN
  Administered 2013-05-02: 100 mg via INTRAVENOUS

## 2013-05-02 MED ORDER — PROPOFOL 10 MG/ML IV BOLUS
INTRAVENOUS | Status: DC | PRN
  Administered 2013-05-02: 300 mg via INTRAVENOUS

## 2013-05-02 SURGICAL SUPPLY — 51 items
ADH SKN CLS APL DERMABOND .7 (GAUZE/BANDAGES/DRESSINGS) ×1
APPLIER CLIP 9.375 MED OPEN (MISCELLANEOUS) ×2
APR CLP MED 9.3 20 MLT OPN (MISCELLANEOUS) ×1
BINDER BREAST LRG (GAUZE/BANDAGES/DRESSINGS) IMPLANT
BINDER BREAST MEDIUM (GAUZE/BANDAGES/DRESSINGS) IMPLANT
BINDER BREAST XLRG (GAUZE/BANDAGES/DRESSINGS) ×1 IMPLANT
BINDER BREAST XXLRG (GAUZE/BANDAGES/DRESSINGS) IMPLANT
BLADE SURG 15 STRL LF DISP TIS (BLADE) ×1 IMPLANT
BLADE SURG 15 STRL SS (BLADE) ×2
CANISTER SUCTION 1200CC (MISCELLANEOUS) ×2 IMPLANT
CHLORAPREP W/TINT 26ML (MISCELLANEOUS) ×2 IMPLANT
CLIP APPLIE 9.375 MED OPEN (MISCELLANEOUS) IMPLANT
CLIP TI WIDE RED SMALL 6 (CLIP) IMPLANT
CLOTH BEACON ORANGE TIMEOUT ST (SAFETY) ×2 IMPLANT
COVER MAYO STAND STRL (DRAPES) ×2 IMPLANT
COVER TABLE BACK 60X90 (DRAPES) ×2 IMPLANT
DECANTER SPIKE VIAL GLASS SM (MISCELLANEOUS) ×2 IMPLANT
DERMABOND ADVANCED (GAUZE/BANDAGES/DRESSINGS) ×1
DERMABOND ADVANCED .7 DNX12 (GAUZE/BANDAGES/DRESSINGS) ×1 IMPLANT
DEVICE DUBIN W/COMP PLATE 8390 (MISCELLANEOUS) ×5 IMPLANT
DRAPE LAPAROSCOPIC ABDOMINAL (DRAPES) IMPLANT
DRAPE PED LAPAROTOMY (DRAPES) ×2 IMPLANT
DRAPE UTILITY XL STRL (DRAPES) ×2 IMPLANT
ELECT COATED BLADE 2.86 ST (ELECTRODE) ×2 IMPLANT
ELECT REM PT RETURN 9FT ADLT (ELECTROSURGICAL) ×2
ELECTRODE REM PT RTRN 9FT ADLT (ELECTROSURGICAL) ×1 IMPLANT
GLOVE BIOGEL PI IND STRL 7.5 (GLOVE) IMPLANT
GLOVE BIOGEL PI IND STRL 8 (GLOVE) ×1 IMPLANT
GLOVE BIOGEL PI INDICATOR 7.5 (GLOVE) ×1
GLOVE BIOGEL PI INDICATOR 8 (GLOVE) ×1
GLOVE ECLIPSE 7.0 STRL STRAW (GLOVE) ×1 IMPLANT
GLOVE ECLIPSE 8.0 STRL XLNG CF (GLOVE) ×2 IMPLANT
GOWN PREVENTION PLUS XLARGE (GOWN DISPOSABLE) ×2 IMPLANT
GOWN PREVENTION PLUS XXLARGE (GOWN DISPOSABLE) ×1 IMPLANT
KIT MARKER MARGIN INK (KITS) ×1 IMPLANT
NDL HYPO 25X1 1.5 SAFETY (NEEDLE) ×1 IMPLANT
NEEDLE HYPO 25X1 1.5 SAFETY (NEEDLE) ×2 IMPLANT
NS IRRIG 1000ML POUR BTL (IV SOLUTION) ×2 IMPLANT
PACK BASIN DAY SURGERY FS (CUSTOM PROCEDURE TRAY) ×2 IMPLANT
PENCIL BUTTON HOLSTER BLD 10FT (ELECTRODE) ×2 IMPLANT
SLEEVE SCD COMPRESS KNEE MED (MISCELLANEOUS) ×2 IMPLANT
SPONGE LAP 4X18 X RAY DECT (DISPOSABLE) ×2 IMPLANT
SUT MON AB 4-0 PC3 18 (SUTURE) ×2 IMPLANT
SUT SILK 2 0 SH (SUTURE) IMPLANT
SUT VIC AB 3-0 SH 27 (SUTURE) ×2
SUT VIC AB 3-0 SH 27X BRD (SUTURE) ×1 IMPLANT
SYR CONTROL 10ML LL (SYRINGE) ×2 IMPLANT
TOWEL OR 17X24 6PK STRL BLUE (TOWEL DISPOSABLE) ×4 IMPLANT
TOWEL OR NON WOVEN STRL DISP B (DISPOSABLE) ×2 IMPLANT
TUBE CONNECTING 20X1/4 (TUBING) ×2 IMPLANT
YANKAUER SUCT BULB TIP NO VENT (SUCTIONS) ×2 IMPLANT

## 2013-05-02 NOTE — Anesthesia Postprocedure Evaluation (Signed)
  Anesthesia Post-op Note  Patient: Erica Roberts  Procedure(s) Performed: Procedure(s) with comments: BREAST LUMPECTOMY WITH NEEDLE LOCALIZATION (Left) - NL BCG 11  Patient Location: PACU  Anesthesia Type:General  Level of Consciousness: awake, alert  and oriented  Airway and Oxygen Therapy: Patient Spontanous Breathing and Patient connected to face mask oxygen  Post-op Pain: mild  Post-op Assessment: Post-op Vital signs reviewed  Post-op Vital Signs: Reviewed  Complications: No apparent anesthesia complications

## 2013-05-02 NOTE — Op Note (Signed)
Preoperative diagnosis: Left breast DCIS  Postop diagnosis: Same  Procedure:LEFT  Partial mastectomy with wire localization  Surgeon: Harriette Bouillon M.D.  Anesthesia: LMA with 0.25% Sensorcaine local  EBL: Less than 40 cc  Specimen:LEFT  Breast mass with wire and clip verified by radiography to pathology ADDITIONAL MARGINS SENT  Drains: None  Indications for procedure: The patient presents with a  LEFT breast mass. Core biopsy showed it to be consistent with DCIS. Options of mastectomy versus breast conservation were discussed. The patient was to proceed with partial mastectomy with wire localization.The procedure has been discussed with the patient. Alternatives to surgery have been discussed with the patient.  Risks of surgery include bleeding,  Infection,  Seroma formation, death,  and the need for further surgery.   The patient understands and wishes to proceed.  Description of procedure: The patient was seen in the holding area and the appropriate side was marked (LEFT). Questions are answered. Wire localization was done the radiology. The patient was taken back to the operating room and placed supine on the operating room table. After induction of general anesthesia,  LEFT chest and upper arm  were prepped and draped in a sterile fashion. Timeout was done and she received preoperative antibiotics. Curvilinear incision was made around the wire insertion site AT SUPERIOR nipple border. All tissue around the wire was excised and hemostasis was achieved with cautery. The clip was not with the wire so additional inferior margin taken which showed the clip.  Additional medial,  Lateral and superior margins taken,  The area was removed in its entirety upon gross examination. Gross margin negative. Radiograph revealed the mass, wire and clip to be in the specimen. Cavity clipped. The wound was closed in layers using 3-0 Vicryl and 4-0 Monocryl subcuticular stitch. Dermabond applied. All final counts  found to be correct. Patient awoke extubated taken recovery in satisfactory condition.

## 2013-05-02 NOTE — H&P (View-Only) (Signed)
Patient ID: Erica Roberts Roberts, female   DOB: 1970/08/07, 42 y.o.   MRN: [id number]  No chief complaint on file.   HPI Erica Roberts Roberts is a 42 y.o. female.  Pt sent at request of Dr Manson Passey for left breast DCIS on core biopsy 1 cm in size 12 oclock.  No breast mass,  Pain or drainage.  No history of breast cancer. HPI  Past Medical History  Diagnosis Date  . Hypertension   . Heartburn   . Anemia     Past Surgical History  Procedure Laterality Date  . Mouth surgery    . Cesarean section  1991  . Laparoscopic hysterectomy N/A 03/04/2013    Procedure: HYSTERECTOMY TOTAL LAPAROSCOPIC;  Surgeon: Purcell Nails, MD;  Location: [facility];  Service: Gynecology;  Laterality: N/A;  TLH possible LAVH possiblel TAH, Cystoscopy  . Cystoscopy Bilateral 03/04/2013    Procedure: CYSTOSCOPY;  Surgeon: Purcell Nails, MD;  Location: [facility];  Service: Gynecology;  Laterality: Bilateral;    History reviewed. No pertinent family history.  Social History History  Substance Use Topics  . Smoking status: Never Smoker   . Smokeless tobacco: Not on file  . Alcohol Use: Yes     Comment: occasionally    No Known Allergies  Current Outpatient Prescriptions  Medication Sig Dispense Refill  . ibuprofen (ADVIL,MOTRIN) 600 MG tablet 1 po pc every 6 hours for 5 days then prn-pain  30 tablet  1  . olmesartan-hydrochlorothiazide (BENICAR HCT) 40-25 MG per tablet Take 1 tablet by mouth daily.      . ondansetron (ZOFRAN) 4 MG tablet Take 1 tablet (4 mg total) by mouth every 8 (eight) hours as needed for nausea.  20 tablet  0  . oxyCODONE-acetaminophen (PERCOCET/ROXICET) 5-325 MG per tablet Take 1-2 tablets by mouth every 4 (four) hours as needed.  30 tablet  0   No current facility-administered medications for this visit.    Review of Systems Review of Systems  Constitutional: Negative for fever, chills and unexpected weight change.  HENT: Negative for hearing loss, congestion, sore throat, trouble  swallowing and voice change.   Eyes: Negative for visual disturbance.  Respiratory: Negative for cough and wheezing.   Cardiovascular: Negative for chest pain, palpitations and leg swelling.  Gastrointestinal: Negative for nausea, vomiting, abdominal pain, diarrhea, constipation, blood in stool, abdominal distention and anal bleeding.  Genitourinary: Negative for hematuria, vaginal bleeding and difficulty urinating.  Musculoskeletal: Negative for arthralgias.  Skin: Negative for rash and wound.  Neurological: Negative for seizures, syncope and headaches.  Hematological: Negative for adenopathy. Does not bruise/bleed easily.  Psychiatric/Behavioral: Negative for confusion.    There were no vitals taken for this visit.  Physical Exam Physical Exam  Constitutional: She is oriented to person, place, and time. She appears well-developed and well-nourished.  HENT:  Head: Normocephalic and atraumatic.  Eyes: EOM are normal. Pupils are equal, round, and reactive to light.  Neck: Normal range of motion. Neck supple.  Cardiovascular: Normal rate and regular rhythm.   Pulmonary/Chest: Right breast exhibits no inverted nipple, no mass, no nipple discharge, no skin change and no tenderness. Left breast exhibits no inverted nipple, no mass, no nipple discharge, no skin change and no tenderness. Breasts are symmetrical.  Musculoskeletal: Normal range of motion.  Lymphadenopathy:    She has no cervical adenopathy.    She has no axillary adenopathy.  Neurological: She is alert and oriented to person, place, and time.  Skin: Skin is warm  and dry.    Data Reviewed Clinical Data: The patient was recalled from screening for left  breast mass  DIGITAL DIAGNOSTIC LEFT MAMMOGRAM AND LEFT BREAST ULTRASOUND:  Comparison: Prior examination dated 02/27/2013  Findings:  ACR Breast Density Category c: The breast tissue is  heterogeneously dense, which may obscure small masses.  Within the left breast 12  o'clock position anterior depth there is  a 1 cm irregular mass which persists on additional spot compression  views.  On physical exam, I palpate no discrete mass within the 12 o'clock  position left breast.  Ultrasound is performed, showing a 0.9 x 0.7 x 0.8 cm irregular  hypoechoic mass with a surrounding echogenic rim. This is located  within the left breast 12 o'clock position 2 cm from the nipple  No left axillary adenopathy.  IMPRESSION:  Suspicious irregular hypoechoic mass within the left breast.  Further evaluation with ultrasound-guided core needle biopsy is  recommended. This has been scheduled for 9/10/20014 at 8 am.  RECOMMENDATION:  Ultrasound-guided core needle biopsy of suspicious left breast mass  (scheduled).  I have discussed the findings and recommendations with the patient.  Results were also provided in writing at the conclusion of the  visit. If applicable, a reminder letter will be sent to the  patient regarding the next appointment.  BI-RADS CATEGORY 4: Suspicious abnormality - biopsy should be  considered.    Assessment    LEFT BREAST DCIS 1 CM central breast    Plan    Pt desires breast conservation.  Will also need genetics.  Will schedule for left breast lumpectomy needle localized. Oncology and radiation therapy to see.  The procedure has been discussed with the patient. Alternatives to surgery have been discussed with the patient.  Risks of surgery include bleeding,  Infection,  Seroma formation, death,  and the need for further surgery.   The patient understands and wishes to proceed.

## 2013-05-02 NOTE — Transfer of Care (Signed)
Immediate Anesthesia Transfer of Care Note  Patient: Erica Roberts  Procedure(s) Performed: Procedure(s) with comments: BREAST LUMPECTOMY WITH NEEDLE LOCALIZATION (Left) - NL BCG 11  Patient Location: PACU  Anesthesia Type:General  Level of Consciousness: awake, oriented and patient cooperative  Airway & Oxygen Therapy: Patient Spontanous Breathing and Patient connected to face mask oxygen  Post-op Assessment: Report given to PACU RN and Post -op Vital signs reviewed and stable  Post vital signs: Reviewed and stable  Complications: No apparent anesthesia complications

## 2013-05-02 NOTE — Anesthesia Procedure Notes (Signed)
Procedure Name: LMA Insertion Date/Time: 05/02/2013 1:50 PM Performed by: Gar Gibbon Pre-anesthesia Checklist: Patient identified, Emergency Drugs available, Suction available and Patient being monitored Patient Re-evaluated:Patient Re-evaluated prior to inductionOxygen Delivery Method: Circle System Utilized Preoxygenation: Pre-oxygenation with 100% oxygen Intubation Type: IV induction Ventilation: Mask ventilation without difficulty LMA: LMA inserted LMA Size: 4.0 Number of attempts: 1 Airway Equipment and Method: bite block Placement Confirmation: positive ETCO2 Tube secured with: Tape Dental Injury: Teeth and Oropharynx as per pre-operative assessment

## 2013-05-02 NOTE — Anesthesia Preprocedure Evaluation (Signed)
Anesthesia Evaluation  Patient identified by MRN, date of birth, ID band Patient awake    Reviewed: Allergy & Precautions, H&P , NPO status , Patient's Chart, lab work & pertinent test results, reviewed documented beta blocker date and time   Airway Mallampati: II TM Distance: >3 FB Neck ROM: full    Dental   Pulmonary neg pulmonary ROS,  breath sounds clear to auscultation        Cardiovascular hypertension, On Medications negative cardio ROS  Rhythm:regular     Neuro/Psych negative neurological ROS  negative psych ROS   GI/Hepatic negative GI ROS, Neg liver ROS,   Endo/Other  negative endocrine ROS  Renal/GU negative Renal ROS  negative genitourinary   Musculoskeletal   Abdominal   Peds  Hematology negative hematology ROS (+)   Anesthesia Other Findings See surgeon's H&P   Reproductive/Obstetrics negative OB ROS                           Anesthesia Physical Anesthesia Plan  ASA: II  Anesthesia Plan: General   Post-op Pain Management:    Induction: Intravenous  Airway Management Planned: LMA  Additional Equipment:   Intra-op Plan:   Post-operative Plan:   Informed Consent: I have reviewed the patients History and Physical, chart, labs and discussed the procedure including the risks, benefits and alternatives for the proposed anesthesia with the patient or authorized representative who has indicated his/her understanding and acceptance.   Dental Advisory Given  Plan Discussed with: CRNA and Surgeon  Anesthesia Plan Comments:         Anesthesia Quick Evaluation

## 2013-05-02 NOTE — Interval H&P Note (Signed)
History and Physical Interval Note:  05/02/2013 1:15 PM  Erica Roberts  has presented today for surgery, with the diagnosis of dcis  The various methods of treatment have been discussed with the patient and family. After consideration of risks, benefits and other options for treatment, the patient has consented to  Procedure(s) with comments: BREAST LUMPECTOMY WITH NEEDLE LOCALIZATION (Left) - NL BCG 11 as a surgical intervention .  The patient's history has been reviewed, patient examined, no change in status, stable for surgery.  I have reviewed the patient's chart and labs.  Questions were answered to the patient's satisfaction.     Landan Fedie A.

## 2013-05-03 ENCOUNTER — Ambulatory Visit: Payer: BC Managed Care – PPO | Admitting: Oncology

## 2013-05-03 ENCOUNTER — Encounter (HOSPITAL_BASED_OUTPATIENT_CLINIC_OR_DEPARTMENT_OTHER): Payer: Self-pay | Admitting: Surgery

## 2013-05-06 ENCOUNTER — Encounter: Payer: Self-pay | Admitting: *Deleted

## 2013-05-06 NOTE — Progress Notes (Signed)
Mailed after appt letter to pt. 

## 2013-05-07 ENCOUNTER — Telehealth (INDEPENDENT_AMBULATORY_CARE_PROVIDER_SITE_OTHER): Payer: Self-pay | Admitting: General Surgery

## 2013-05-07 NOTE — Telephone Encounter (Signed)
Patient called in today asking for her pathology report from surgery on 05/02/13.  Explained that they still have not come in but that I would send this message to Dr. Rosezena Sensor assistant to see if she could call pathology and see what the hold up is.  Informed her that we would give her a call as soon as we have the pathology report.

## 2013-05-08 ENCOUNTER — Telehealth (INDEPENDENT_AMBULATORY_CARE_PROVIDER_SITE_OTHER): Payer: Self-pay

## 2013-05-08 NOTE — Telephone Encounter (Signed)
Message copied by Brennan Bailey on Wed May 08, 2013  4:13 PM ------      Message from: Harriette Bouillon A      Created: Tue May 07, 2013  6:29 PM       MARGINS CLEAR NO RESIDUAL DISEASE LOOKS GOOD ------

## 2013-05-08 NOTE — Telephone Encounter (Signed)
LMOM> path results below. Please relay message to pt when she calls back.

## 2013-05-13 ENCOUNTER — Encounter (INDEPENDENT_AMBULATORY_CARE_PROVIDER_SITE_OTHER): Payer: Self-pay | Admitting: Surgery

## 2013-05-13 ENCOUNTER — Encounter (INDEPENDENT_AMBULATORY_CARE_PROVIDER_SITE_OTHER): Payer: Self-pay

## 2013-05-13 ENCOUNTER — Ambulatory Visit (INDEPENDENT_AMBULATORY_CARE_PROVIDER_SITE_OTHER): Payer: BC Managed Care – PPO | Admitting: Surgery

## 2013-05-13 VITALS — BP 140/100 | HR 80 | Temp 99.2°F | Resp 15 | Ht 68.5 in | Wt 236.4 lb

## 2013-05-13 DIAGNOSIS — Z9889 Other specified postprocedural states: Secondary | ICD-10-CM

## 2013-05-13 NOTE — Progress Notes (Signed)
Patient returns after left breast lumpectomy for DCIS. Pathology showed no residual disease. The specimen radiographs revealed the clip and wire to be in 2 separate specimens. The pathologist found the original biopsy cavity in nature dissection through at. All margins were grossly negative and microscopically negative.  Exam: Circumareolar incision well healed. No signs of infection. Minimal seroma left breast.  Impression: Small focus of DCIS left breast status post lumpectomy widely negative margins  Plan: Difficult to determine the exact size of her DCIS and was all removed core biopsy. Refer to radiation oncology for opinion. Return 3 months.

## 2013-05-13 NOTE — Patient Instructions (Signed)
Follow up 3 months

## 2013-05-14 ENCOUNTER — Encounter: Payer: Self-pay | Admitting: Dietician

## 2013-05-14 NOTE — Progress Notes (Signed)
Breast Cancer Nutrition Class Attendance Note  Date: 05/14/2013  Time: 1800  Pt attended [facility]'s Breast Cancer Nutrition Class, "Food For Your Fight". Pt was educated on basic cancer nutrition principles, including plant based diet and principles from [employer]  [employer] for [employer]) about latest nutrition findings and recommendations. Questions answered. Handouts and recipes provided.   Kashmir Leedy A. Mayford Knife, RD, LDN Pager: [phone]

## 2013-05-15 ENCOUNTER — Ambulatory Visit (HOSPITAL_BASED_OUTPATIENT_CLINIC_OR_DEPARTMENT_OTHER): Payer: BC Managed Care – PPO | Admitting: Oncology

## 2013-05-15 ENCOUNTER — Telehealth: Payer: Self-pay | Admitting: Oncology

## 2013-05-15 ENCOUNTER — Encounter: Payer: Self-pay | Admitting: Oncology

## 2013-05-15 VITALS — BP 153/96 | HR 102 | Temp 98.5°F | Resp 18 | Ht 68.5 in | Wt 233.8 lb

## 2013-05-15 DIAGNOSIS — C50112 Malignant neoplasm of central portion of left female breast: Secondary | ICD-10-CM

## 2013-05-15 DIAGNOSIS — Z170 Estrogen receptor positive status [ER+]: Secondary | ICD-10-CM

## 2013-05-15 DIAGNOSIS — D0590 Unspecified type of carcinoma in situ of unspecified breast: Secondary | ICD-10-CM

## 2013-05-15 NOTE — Progress Notes (Signed)
Erica Roberts [id number] 12-14-1970 42 y.o. 05/15/2013 11:22 AM  CC  Altamese Cornland, MD [address] [place] [address] Dr. Harriette Bouillon Dr. Dorothy Puffer  DIAGNOSIS: 42 year old female with new diagnosis of DCIS of the left breast. Patient is seen in the multidisciplinary breast clinic for discussion of treatment options.   STAGE:   Cancer of central portion of female breast   Primary site: Breast (Left)   Staging method: AJCC 7th Edition   Clinical: Stage 0 (Tis (DCIS), N0, cM0)   Summary: Stage 0 (Tis (DCIS), N0, cM0)  REFERRING PHYSICIAN: Dr. Maisie Fus Cornett  HISTORY OF PRESENT ILLNESS:  Erica Roberts is a 42 y.o. female.    #1 medical history significant for hypertension and anemia.   #2Patient underwent a screening mammogram that revealed suspicious findings. Ultrasound showed a 9 mm lesion her in a biopsy was performed this revealed low grade ductal carcinoma in situ. The receptor studies are revealing the tumor to be ER positive PR positive. MRI is pending. Her case was discussed at the multidisciplinary breast clinic and conference.   #3  Past Medical History: Past Medical History  Diagnosis Date  . Hypertension   . Heartburn   . Anemia   . Wears glasses     Past Surgical History: Past Surgical History  Procedure Laterality Date  . Mouth surgery    . Cesarean section  1991  . Laparoscopic hysterectomy N/A 03/04/2013    Procedure: HYSTERECTOMY TOTAL LAPAROSCOPIC;  Surgeon: Purcell Nails, MD;  Location: [facility];  Service: Gynecology;  Laterality: N/A;  TLH possible LAVH possiblel TAH, Cystoscopy  . Cystoscopy Bilateral 03/04/2013    Procedure: CYSTOSCOPY;  Surgeon: Purcell Nails, MD;  Location: [facility];  Service: Gynecology;  Laterality: Bilateral;  . Lipoma excision  2005    lt shoulder  . Breast lumpectomy with needle localization Left 05/02/2013    Procedure: BREAST LUMPECTOMY WITH NEEDLE LOCALIZATION;  Surgeon:  Maisie Fus A. Cornett, MD;  Location: [facility];  Service: General;  Laterality: Left;  NL BCG 11    Family History: History reviewed. No pertinent family history.  Social History History  Substance Use Topics  . Smoking status: Never Smoker   . Smokeless tobacco: Not on file  . Alcohol Use: Yes     Comment: occasionally    Allergies: No Known Allergies  Current Medications: Current Outpatient Prescriptions  Medication Sig Dispense Refill  . ibuprofen (ADVIL,MOTRIN) 600 MG tablet 1 po pc every 6 hours for 5 days then prn-pain  30 tablet  1  . olmesartan-hydrochlorothiazide (BENICAR HCT) 40-25 MG per tablet Take 1 tablet by mouth daily.      . ondansetron (ZOFRAN) 4 MG tablet Take 1 tablet (4 mg total) by mouth every 8 (eight) hours as needed for nausea.  20 tablet  0  . oxyCODONE-acetaminophen (ROXICET) 5-325 MG per tablet Take 1 tablet by mouth every 4 (four) hours as needed for pain.  30 tablet  0   No current facility-administered medications for this visit.    OB/GYN History:menarche at age 61 she is premenopausal first live birth at 52. She has completed her family  Fertility Discussion: she has completed her family Prior History of Cancer: no  Health Maintenance:  Colonoscopy no Bone Density no Last PAP smear up-to-date  ECOG PERFORMANCE STATUS: 0 - Asymptomatic  Genetic Counseling/testing: yes  REVIEW OF SYSTEMS:  A comprehensive review of systems was negative.  PHYSICAL EXAMINATION: Blood pressure 153/96,  pulse 102, temperature 98.5 F (36.9 C), temperature source Oral, resp. rate 18, height 5' 8.5" (1.74 m), weight 233 lb 12.8 oz (106.051 kg).  ZOX:WRUEA, healthy, no distress, well nourished and well developed SKIN: skin color, texture, turgor are normal, no rashes or significant lesions HEAD: Normocephalic EYES: PERRLA, EOMI, Conjunctiva are pink and non-injected EARS: External ears normal OROPHARYNX:no exudate, no erythema and lips,  buccal mucosa, and tongue normal  NECK: no adenopathy, thyroid normal size, non-tender, without nodularity LYMPH:  no palpable lymphadenopathy, no hepatosplenomegaly BREAST:breasts appear normal, no suspicious masses, no skin or nipple changes or axillary nodes LUNGS: clear to auscultation and percussion HEART: regular rate & rhythm, no murmurs and no gallops ABDOMEN:abdomen soft, non-tender, normal bowel sounds and no masses or organomegaly BACK: Back symmetric, no curvature., No CVA tenderness EXTREMITIES:no edema, no clubbing, no cyanosis  NEURO: alert & oriented x 3 with fluent speech, no focal motor/sensory deficits, gait normal     STUDIES/RESULTS: US Breast Left  03/26/13   *RADIOLOGY REPORT*  Clinical Data:  The patient was recalled from screening for left breast mass  DIGITAL DIAGNOSTIC LEFT MAMMOGRAM  AND LEFT BREAST ULTRASOUND:  Comparison:  Prior examination dated 02/27/2013  Findings:  ACR Breast Density Category c:  The breast tissue is heterogeneously dense, which may obscure small masses.  Within the left breast 12 o'clock position anterior depth there is a 1 cm irregular mass which persists on additional spot compression views.  On physical exam, I palpate no discrete mass within the 12 o'clock position left breast.  Ultrasound is performed, showing a 0.9 x 0.7 x 0.8 cm irregular hypoechoic mass with a surrounding echogenic rim.  This is located within the left breast 12 o'clock position 2 cm from the nipple  No left axillary adenopathy.  IMPRESSION:  Suspicious irregular hypoechoic mass within the left breast. Further evaluation with ultrasound-guided core needle biopsy is recommended.  This has been scheduled for 9/10/20014 at 8 am.  RECOMMENDATION: Ultrasound-guided core needle biopsy of suspicious left breast mass (scheduled).  I have discussed the findings and recommendations with the patient. Results were also provided in writing at the conclusion of the visit.  If  applicable, a reminder letter will be sent to the patient regarding the next appointment.  BI-RADS CATEGORY 4:  Suspicious abnormality - biopsy should be considered.   Original Report Authenticated By: Annia Belt, M.D   [facility]  March 26, 2013   *RADIOLOGY REPORT*  Clinical Data:  The patient was recalled from screening for left breast mass  DIGITAL DIAGNOSTIC LEFT MAMMOGRAM  AND LEFT BREAST ULTRASOUND:  Comparison:  Prior examination dated 02/27/2013  Findings:  ACR Breast Density Category c:  The breast tissue is heterogeneously dense, which may obscure small masses.  Within the left breast 12 o'clock position anterior depth there is a 1 cm irregular mass which persists on additional spot compression views.  On physical exam, I palpate no discrete mass within the 12 o'clock position left breast.  Ultrasound is performed, showing a 0.9 x 0.7 x 0.8 cm irregular hypoechoic mass with a surrounding echogenic rim.  This is located within the left breast 12 o'clock position 2 cm from the nipple  No left axillary adenopathy.  IMPRESSION:  Suspicious irregular hypoechoic mass within the left breast. Further evaluation with ultrasound-guided core needle biopsy is recommended.  This has been scheduled for 9/10/20014 at 8 am.  RECOMMENDATION: Ultrasound-guided core needle biopsy of suspicious left breast mass (scheduled).  I have  discussed the findings and recommendations with the patient. Results were also provided in writing at the conclusion of the visit.  If applicable, a reminder letter will be sent to the patient regarding the next appointment.  BI-RADS CATEGORY 4:  Suspicious abnormality - biopsy should be considered.   Original Report Authenticated By: Annia Belt, M.D   [facility]  03/28/2013   *RADIOLOGY REPORT*  Clinical Data:  Status post ultrasound guided core biopsy of mass in the subareolar region of the left breast.  DIGITAL DIAGNOSTIC LEFT MAMMOGRAM  Comparison:  Previous  exams.  Findings:  Mammographic images were obtained following ultrasound guided biopsy of mass in the subareolar region of the left breast. A ribbon shaped clip is identified in the suspicious mass.  IMPRESSION: Tissue marker clip is in expected location after biopsy.  Final Assessment:  Post Procedure Mammograms for Marker Placement   Original Report Authenticated By: Norva Pavlov, M.D.   [place] Lt Breast Bx W Loc Dev 1st Lesion Img Bx Spec US Guide  03/29/2013   **ADDENDUM** CREATED: 03/29/2013 13:04:23  Pathology revealed ductal carcinoma in situ.  This is found to be concordant with imaging findings.  I discussed the results over the phone with the patient and answered her questions.  The patient states she is doing well post biopsy without complications.  Recommendations:  MRI of the breasts scheduled for April 03, 2013 8:00 a.m.  MDC clinic on schedule for April 03, 2013.  **END ADDENDUM** SIGNED BY: Sherian Rein, M.D.  03/28/2013   *RADIOLOGY REPORT*  Clinical Data:  The patient returns for ultrasound-guided core biopsy of the left breast.  ULTRASOUND GUIDED VACUUM ASSISTED CORE BIOPSY OF THE LEFT BREAST  Comparison: Previous exams.  I met with the patient and we discussed the procedure of ultrasound- guided biopsy, including benefits and alternatives.  We discussed the high likelihood of a successful procedure. We discussed the risks of the procedure including infection, bleeding, tissue injury, clip migration, and inadequate sampling.  Informed written consent was given. The usual time-out protocol was performed immediately prior to the procedure.  Using sterile technique and 2% Lidocaine as local anesthetic, under direct ultrasound visualization, a 12 gauge vacuum-assisted device was used to perform biopsy of lesion in the subareolar region of the left breast using a lateral approach. At the conclusion of the procedure, a ribbon shaped tissue marker clip was deployed into the biopsy cavity.   Follow-up 2-view mammogram was performed and dictated separately.  IMPRESSION: Ultrasound-guided biopsy of left breast mass.  No apparent complications.   Original Report Authenticated By: Norva Pavlov, M.D.     LABS:    Chemistry      Component Value Date/Time   NA 142 04/03/2013 1230   NA 142 02/28/2013 1020   K 4.3 04/03/2013 1230   K 4.1 02/28/2013 1020   CL 106 02/28/2013 1020   CO2 29 04/03/2013 1230   CO2 26 02/28/2013 1020   BUN 12.3 04/03/2013 1230   BUN 14 02/28/2013 1020   CREATININE 1.2* 04/03/2013 1230   CREATININE 0.84 02/28/2013 1020      Component Value Date/Time   CALCIUM 9.7 04/03/2013 1230   CALCIUM 10.2 02/28/2013 1020   ALKPHOS 73 04/03/2013 1230   AST 12 04/03/2013 1230   ALT 12 04/03/2013 1230   BILITOT 0.54 04/03/2013 1230      Lab Results  Component Value Date   WBC 7.6 04/03/2013   HGB 12.6 05/02/2013   HCT 34.4*  04/03/2013   MCV 88.2 04/03/2013   PLT 243 04/03/2013   PATHOLOGY: ADDITIONAL INFORMATION: PROGNOSTIC INDICATORS - ACIS Results: IMMUNOHISTOCHEMICAL AND MORPHOMETRIC ANALYSIS BY THE AUTOMATED CELLULAR IMAGING SYSTEM (ACIS) Estrogen Receptor: 78%, POSITIVE, STRONG STAINING INTENSITY Progesterone Receptor: 80%, POSITIVE, STRONG STAINING INTENSITY REFERENCE RANGE ESTROGEN RECEPTOR NEGATIVE <1% POSITIVE =>1% PROGESTERONE RECEPTOR NEGATIVE <1% POSITIVE =>1% All controls stained appropriately Pecola Leisure MD Pathologist, Electronic Signature ( Signed 04/03/2013) FINAL DIAGNOSIS Diagnosis Breast, left, needle core biopsy, mass, subareolar - DUCTAL CARCINOMA IN SITU. - SEE COMMENT. 1 of 2 FINAL for SHIRLEEN, MCFAUL ([id number]) Microscopic Comment The carcinoma appears low to intermediate grade. Estrogen receptor and progesterone receptor studies will be performed and the results reported separately. The results were called to the [facility] on 03/29/2013. (JBK:kh 03/29/13) Pecola Leisure MD Pathologist, Electronic  Signature (Case signed 03/29/2013) SFINAL DIAGNOSIS Diagnosis 1. Breast, lumpectomy, Left - BENIGN BREAST TISSUE, SEE COMMENT. - NEGATIVE FOR ATYPIA OR MALIGNANCY. - SURGICAL MARGIN, NEGATIVE FOR ATYPIA OR MALIGNANCY. 2. Breast, excision, Left additional inferior margins - SCLEROSED INTRADUCTAL PAPILLOMA, SEE COMMENT. - PREVIOUS BIOPSY SITE IDENTIFIED. 3. Breast, excision, Left additional medial margins - BENIGN BREAST TISSUE, SEE COMMENT. - NEGATIVE FOR ATYPIA OR MALIGNANCY. 4. Breast, excision, Left additional superior margins - BENIGN BREAST TISSUE, SEE COMMENT. - NEGATIVE FOR ATYPIA OR MALIGNANCY. 5. Breast, excision, Left lateral margins - BENIGN BREAST, SEE COMMENT. - NEGATIVE FOR ATYPIA OR MALIGNANCY. Microscopic Comment 1. The surgical resection margin(s) of the specimen were inked and microscopically evaluated. Although there was no biopsy clip identified, the 0.7 cm ill defined area of hemorrhage was entirely submitted. The area of hemorrhage as well as other representative sections do not demonstrate any atypical or malignant epithelial or stromal findings. 2. The surgical resection margin(s) of the specimen were inked and microscopically evaluated. The 0.9 cm indurated white nodule grossly identified was submitted for review. Slide sections demonstrate sclerosed intraductal papilloma with associated fat necrosis. The myoepithelial layer was demonstrated with smooth muscle myosin heavy chain, Calponin and p63 immunostains. There is no aberrant cytokeratin 5/6 expression within the epithelium. The papilloma is immediately adjacent to and involved by previous biopsy site change. The remaining representative sections demonstrate non-neoplastic findings to include fibrocystic change, pseudoangiomatous stromal hyperplasia and usual ductal hyperplasia. (CR:kh 05-07-13) 3. The surgical resection margin(s) of the specimen were inked and microscopically evaluated. 4. The surgical  resection margin(s) of the specimen were inked and microscopically evaluated. 1 of 3 FINAL for SHATIRA, DOBOSZ ([id number]) Microscopic Comment(continued) 5. The surgical resection margin(s) of the specimen were inked and microscopically evaluated. Representative sections demonstrate non-neoplastic findings to include fibrocystic change. There are no atypical or malignant epithelial or stromal findings identified. The surgical marginspecimen Gross and Clinica ASSESSMENT    42 year old female with  #1 new diagnosis of low-grade ductal carcinoma in situ of the left breast the tumor was ER positive PR positive. Patient is seen in the multidisciplinary breast clinic for discussion of treatment options. She is clinically stage 0. Recommendation is lumpectomy with sentinel lymph node biopsy.  #2 patient is now status post left lumpectomy with the final pathology revealing DCIS only. Sentinel nodes were negative.  #3 patient will be referred to radiation oncology for adjuvant radiation therapy. Once she completes that we will plan on starting her adjuvant tamoxifen.    PLAN:   #1 refer to radiation therapy.  #20 see the patient back in 2 months for followup   Thank you so much for allowing  me to participate in the care of [employer]. I will continue to follow up the patient with you and assist in her care.  All questions were answered. The patient knows to call the clinic with any problems, questions or concerns. We can certainly see the patient much sooner if necessary.  I spent 15 minutes counseling the patient face to face. The total time spent in the appointment was 15 minutes.   Drue Second, MD Medical/Oncology [facility] [phone] (beeper) [phone] (Office)  05/15/2013, 11:22 AM

## 2013-05-15 NOTE — Patient Instructions (Signed)
Proceed with RT first  I will see you back in 2 months

## 2013-05-17 ENCOUNTER — Encounter: Payer: Self-pay | Admitting: Radiation Oncology

## 2013-05-17 NOTE — Progress Notes (Signed)
Location of Breast Cancer::03/28/13: Diagnosis Breast, left,   Histology per Pathology Report: :03/28/13: Diagnosis Breast, left, needle core biopsy, mass, subareolar- DUCTAL CARCINOMA IN SIT:U  Receptor Status: ER(+), PR (+), Her2-neu ( )  Did patient present with symptoms (if so, please note symptoms) or was this found on screening mammography?:sreening mammogram found U/S 9mm lesion   Past/Anticipated interventions by surgeon, if any10/09/14: Diagnosis1. Breast, lumpectomy, Left- BENIGN BREAST TISSUE, SEE COMMENT.- NEGATIVE FOR ATYPIA OR MALIGNANCY.- SURGICAL MARGIN, NEGATIVE FOR ATYPIA OR MALIGNANCY.2. Breast, excision, Left additional inferior margins- SCLEROSED INTRADUCTAL PAPILLOMA, SEE COMMENT.- PREVIOUS BIOPSY SITE IDENTIFIED.3. Breast, excision, Left additional medial margins - BENIGN BREAST TISSUE, SEE COMMENT.- NEGATIVE FOR ATYPIA OR MALIGNANCY.4. Breast, excision, Left additional superior margins- BENIGN BREAST TISSUE, SEE COMMENT.- NEGATIVE FOR ATYPIA OR MALIGNANCY.5. Breast, excision, Left lateral margins- BENIGN BREAST, SEE COMMENT.- NEGATIVE FOR ATYPIA OR MALIGNANCY, Dr. Maisie Fus Cornett 05/13/13 : Exam: Circumareolar incision well healed. No signs of infection. Minimal seroma left breast.  Impression: Small focus of DCIS left breast status post lumpectomy widely negative margins  Plan: Difficult to determine the exact size of her DCIS and was all removed core biopsy. Refer to radiation oncology for opinion. Return 3 months. Dr.Tjhomas Cornett     03/04/13: Procedure: HYSTERECTOMY TOTAL LAPAROSCOPIC BILATERAL SALPINGECTOMY CYSTOSCOPY   Purcell Nails, MD Assistant: Jaymes Graff, MD  Findings:  Several fibroids and noted was fitz [employer] curtis with small amount of adhesions to the adnexa  Pathology: uterus and cervix (greater than 250g)      Past/Anticipated interventions by medical oncology, if any: Chemotherapy Seen in multidisciplinary clinic,, refer to genetics and lab  06/06/13,after radiation will plan starting adjuvant tamoxifen folow up appt 07/22/13  Lymphedema issues, if any:  no  Pain issues, if [id number] at incicion site on left nipple, slight swelling,hardness, and healing bruise    SAFETY ISSUES:  Prior radiation? no  Pacemaker/ICD? no  Possible current pregnancy?no  Is the patient on methotrexate? no  Current Complaints / other details:  Divorced, , menses age 2, 1st live birth age 44, daughter ,hysterectomy, no family members of cancer    Erica Roberts, Gloriann Loan, RN 05/17/2013,10:52 AM

## 2013-05-22 ENCOUNTER — Ambulatory Visit
Admission: RE | Admit: 2013-05-22 | Discharge: 2013-05-22 | Disposition: A | Discharge status: Home / Self Care | Payer: BC Managed Care – PPO | Source: Ambulatory Visit | Attending: Radiation Oncology | Admitting: Radiation Oncology

## 2013-05-22 ENCOUNTER — Encounter: Payer: Self-pay | Admitting: Radiation Oncology

## 2013-05-22 VITALS — BP 143/92 | HR 79 | Temp 98.1°F | Resp 20 | Ht 68.5 in | Wt 236.2 lb

## 2013-05-22 DIAGNOSIS — D0590 Unspecified type of carcinoma in situ of unspecified breast: Secondary | ICD-10-CM | POA: Insufficient documentation

## 2013-05-22 DIAGNOSIS — C50112 Malignant neoplasm of central portion of left female breast: Secondary | ICD-10-CM

## 2013-05-22 HISTORY — DX: Malignant neoplasm of unspecified site of unspecified female breast: C50.919

## 2013-05-22 NOTE — Progress Notes (Signed)
Please see the Nurse Progress Note in the MD Initial Consult Encounter for this patient. 

## 2013-05-22 NOTE — Progress Notes (Signed)
Radiation Oncology         [phone] ________________________________  Name: Erica Roberts MRN: [id number]  Date: 05/22/2013  DOB: 07-12-1971  Follow-Up Visit Note  CC: [place], MD  Victorino December, MD  Diagnosis:   DCIS of the left breast  Interval Since Last Radiation:  Not applicable   Narrative:  The patient returns today for routine follow-up.  The patient was initially seen in multidisciplinary breast clinic. She has proceeded with a lumpectomy and no residual breast cancer was seen. Therefore wide negative margins have been obtained. The patient indicates that she has been healing well since her surgery. She has had some postoperative discomfort, non-more than expected.                              ALLERGIES:  has No Known Allergies.  Meds: Current Outpatient Prescriptions  Medication Sig Dispense Refill  . ibuprofen (ADVIL,MOTRIN) 600 MG tablet 1 po pc every 6 hours for 5 days then prn-pain  30 tablet  1  . olmesartan-hydrochlorothiazide (BENICAR HCT) 40-25 MG per tablet Take 1 tablet by mouth daily.      . ondansetron (ZOFRAN) 4 MG tablet Take 1 tablet (4 mg total) by mouth every 8 (eight) hours as needed for nausea.  20 tablet  0  . oxyCODONE-acetaminophen (ROXICET) 5-325 MG per tablet Take 1 tablet by mouth every 4 (four) hours as needed for pain.  30 tablet  0   No current facility-administered medications for this encounter.    Physical Findings: The patient is in no acute distress. Patient is alert and oriented.  height is 5' 8.5" (1.74 m) and weight is 236 lb 3.2 oz (107.14 kg). Her oral temperature is 98.1 F (36.7 C). Her blood pressure is 143/92 and her pulse is 79. Her respiration is 20. .   The patient's lumpectomy incision is healing well. No sign of infection.  Lab Findings: Lab Results  Component Value Date   WBC 7.6 04/03/2013   HGB 12.6 05/02/2013   HCT 34.4* 04/03/2013   MCV 88.2 04/03/2013   PLT 243 04/03/2013     Radiographic  Findings: Mm Lt Plc Breast Loc Dev   1st Lesion  Inc Mammo Guide  05/02/2013   CLINICAL DATA:  Patient presents for left needle localization.  EXAM: RADIOLOGY EXAMINATION  COMPARISON:  Previous exams.  FINDINGS: Patient presents for needle localization prior to lumpectomy. I met with the patient and we discussed the procedure of needle localization including benefits and alternatives. We discussed the high likelihood of a successful procedure. We discussed the risks of the procedure, including infection, bleeding, tissue injury, and further surgery. Informed, written consent was given. The usual time-out protocol was performed immediately prior to the procedure.  Using mammographic guidance, sterile technique, 2% lidocaine and a 7 cm modified Kopans needle, the biopsy site marker was localized using a superior to inferior approach approach. The films were marked for Dr. Luisa Hart.  Specimen radiograph was performed at day surgery, and confirms the biopsy site marker and hookwire to be present in the tissue sample. The specimen was marked for pathology.  IMPRESSION: Needle localization of the left breast. No apparent complications.   Electronically Signed   By: Jerene Dilling M.D.   On: 05/02/2013 15:21    Impression:    The patient is status post a lumpectomy for a small DCIS of the left breast. No residual disease seen on  lumpectomy. I discussed with the patient a typical role of adjuvant radiotherapy in the setting of breast conservation treatment. Efforts to determine a low risk category of patient's to omit radiation have been unsuccessful. Given her age and this initiated, I discussed with her proceeding with the original plan consisting of lumpectomy/subsequent adjuvant radiation treatment.  We therefore discussed a 6-1/2 week course of radiation. We discussed the benefit of this treatment in reducing the chances of local failure. We also discussed the possible side effects and risks of treatment as  well. All of her questions were answered. She does wish to proceed with this treatment plan.  Plan:  The patient will proceed with a simulation in the near future such that treatment planning. The patient's appears to be a good candidate for a prone breast treatment technique.  I spent 15 minutes with the patient today, the majority of which was spent counseling the patient on the diagnosis of cancer and coordinating care.   Radene Gunning, M.D., Ph.D.

## 2013-05-24 ENCOUNTER — Ambulatory Visit
Admission: RE | Admit: 2013-05-24 | Discharge: 2013-05-24 | Disposition: A | Discharge status: Home / Self Care | Payer: BC Managed Care – PPO | Source: Ambulatory Visit | Attending: Radiation Oncology | Admitting: Radiation Oncology

## 2013-05-24 DIAGNOSIS — L819 Disorder of pigmentation, unspecified: Secondary | ICD-10-CM | POA: Insufficient documentation

## 2013-05-24 DIAGNOSIS — C50919 Malignant neoplasm of unspecified site of unspecified female breast: Secondary | ICD-10-CM | POA: Insufficient documentation

## 2013-05-24 DIAGNOSIS — C50112 Malignant neoplasm of central portion of left female breast: Secondary | ICD-10-CM

## 2013-05-24 DIAGNOSIS — Z510 Encounter for antineoplastic radiation therapy: Secondary | ICD-10-CM | POA: Insufficient documentation

## 2013-05-24 DIAGNOSIS — R5381 Other malaise: Secondary | ICD-10-CM | POA: Insufficient documentation

## 2013-05-24 DIAGNOSIS — L299 Pruritus, unspecified: Secondary | ICD-10-CM | POA: Insufficient documentation

## 2013-05-24 NOTE — Progress Notes (Signed)
  Radiation Oncology         [phone] ________________________________  Name: Erica Roberts MRN: [id number]  Date: 05/24/2013  DOB: 01/29/1971   SIMULATION AND TREATMENT PLANNING NOTE  The patient presented for simulation prior to beginning her course of radiation treatment for her diagnosis of left-sided breast cancer. The patient was placed in a supine position on a breast board. A customized accuform device was also constructed, and this complex treatment device will be used on a daily basis during her treatment. In this fashion, a CT scan was obtained through the chest area and an isocenter was placed near the chest wall within the left breast. Both a free breathing scan and a breath-hold scan were both obtained to determine which technique was optimal, and to see if the breath-hold technique markedly improved the anticipated dose to the heart. This was the case. Therefore we will use this technique.  The patient will be planned to receive a course of radiation initially to a dose of 50.4 gray. This will consist of a whole breast radiotherapy technique. To accomplish this, 2 customized blocks have been designed which will correspond to medial and lateral whole breast tangent fields. This treatment will be accomplished at 1.8 gray per fraction. A complex isodose plan is requested to ensure that the breast target area is adequately covered dosimetrically. A forward planning technique will also be evaluated to determine if this approach improves the plan. It is anticipated that the patient will then receive a 10 gray boost to the seroma cavity which has been contoured. This will be accomplished at 2 gray per fraction. The final anticipated total dose therefore will correspond to 60.4 gray.  This initial treatment will consist of a 3-D conformal technique. The seroma has been contoured as the primary target structure. Additionally, dose volume histograms of both this target as well as the  lungs and heart will also be evaluated. Such an approach is necessary to ensure that the target area is adequately covered while the nearby critical normal structures are adequately spared.   _______________________________   Radene Gunning, MD, PhD

## 2013-05-24 NOTE — Progress Notes (Signed)
.   Radiation Oncology         [phone] ________________________________  Name: Erica Roberts MRN: [id number]  Date: 05/24/2013  DOB: 1970-10-30  RESPIRATORY MOTION MANAGEMENT SIMULATION  NARRATIVE:  In order to account for effect of respiratory motion on target structures and other organs in the planning and delivery of radiotherapy, this patient underwent respiratory motion management simulation.  To accomplish this, when the patient was brought to the CT simulation planning suite, 4D respiratoy motion management CT images were obtained.  The CT images were loaded into the planning software.  Then, using a variety of tools including Cine, MIP, and standard views, the target volume and planning target volumes (PTV) were delineated.  Avoidance structures were contoured.  Treatment planning then occurred.  Dose volume histograms were generated and reviewed for each of the requested structure.  The resulting plan was carefully reviewed and approved today.   ------------------------------------------------  Radene Gunning, MD, PhD

## 2013-05-29 ENCOUNTER — Encounter: Payer: Self-pay | Admitting: Oncology

## 2013-05-29 NOTE — Progress Notes (Signed)
Put [employer] disability form on nurse's desk. °

## 2013-05-31 ENCOUNTER — Encounter: Payer: Self-pay | Admitting: Oncology

## 2013-05-31 NOTE — Progress Notes (Signed)
Faxed disability form to [employer] @ [phone] °

## 2013-06-03 ENCOUNTER — Ambulatory Visit
Admission: RE | Admit: 2013-06-03 | Discharge: 2013-06-03 | Disposition: A | Discharge status: Home / Self Care | Payer: BC Managed Care – PPO | Source: Ambulatory Visit | Attending: Radiation Oncology | Admitting: Radiation Oncology

## 2013-06-03 ENCOUNTER — Encounter: Payer: Self-pay | Admitting: Radiation Oncology

## 2013-06-03 ENCOUNTER — Ambulatory Visit
Admission: RE | Admit: 2013-06-03 | Payer: BC Managed Care – PPO | Source: Ambulatory Visit | Admitting: Radiation Oncology

## 2013-06-03 NOTE — Progress Notes (Signed)
Simulation verification note: The patient underwent simulation verification for her left breast with deep inspiration/breath-hold. Her isocenter is in good position and the multileaf collimators contoured the treatment volume appropriately.

## 2013-06-04 ENCOUNTER — Ambulatory Visit
Admission: RE | Admit: 2013-06-04 | Discharge: 2013-06-04 | Disposition: A | Discharge status: Home / Self Care | Payer: BC Managed Care – PPO | Source: Ambulatory Visit | Attending: Radiation Oncology | Admitting: Radiation Oncology

## 2013-06-05 ENCOUNTER — Ambulatory Visit
Admission: RE | Admit: 2013-06-05 | Discharge: 2013-06-05 | Disposition: A | Discharge status: Home / Self Care | Payer: BC Managed Care – PPO | Source: Ambulatory Visit | Attending: Radiation Oncology | Admitting: Radiation Oncology

## 2013-06-05 ENCOUNTER — Encounter: Payer: Self-pay | Admitting: Radiation Oncology

## 2013-06-05 VITALS — BP 154/88 | HR 88 | Resp 16 | Wt 237.0 lb

## 2013-06-05 DIAGNOSIS — C50112 Malignant neoplasm of central portion of left female breast: Secondary | ICD-10-CM

## 2013-06-05 NOTE — Progress Notes (Signed)
   Department of Radiation Oncology  Phone:  [phone] Fax:        [phone]  Weekly Treatment Note    Name: Erica Roberts Date: 06/05/2013 MRN: [id number] DOB: [birth year]   Current dose: 3.6 Gy  Current fraction: 2   MEDICATIONS: Current Outpatient Prescriptions  Medication Sig Dispense Refill  . ibuprofen (ADVIL,MOTRIN) 600 MG tablet 1 po pc every 6 hours for 5 days then prn-pain  30 tablet  1  . olmesartan-hydrochlorothiazide (BENICAR HCT) 40-25 MG per tablet Take 1 tablet by mouth daily.      . ondansetron (ZOFRAN) 4 MG tablet Take 1 tablet (4 mg total) by mouth every 8 (eight) hours as needed for nausea.  20 tablet  0  . oxyCODONE-acetaminophen (ROXICET) 5-325 MG per tablet Take 1 tablet by mouth every 4 (four) hours as needed for pain.  30 tablet  0   No current facility-administered medications for this encounter.     ALLERGIES: Review of patient's allergies indicates no known allergies.   LABORATORY DATA:  Lab Results  Component Value Date   WBC 7.6 04/03/2013   HGB 12.6 05/02/2013   HCT 34.4* 04/03/2013   MCV 88.2 04/03/2013   PLT 243 04/03/2013   Lab Results  Component Value Date   NA 142 04/03/2013   K 4.3 04/03/2013   CL 106 02/28/2013   CO2 29 04/03/2013   Lab Results  Component Value Date   ALT 12 04/03/2013   AST 12 04/03/2013   ALKPHOS 73 04/03/2013   BILITOT 0.54 04/03/2013     NARRATIVE: Jeanmarie Hubert Bocchino was seen today for weekly treatment management. The chart was checked and the patient's films were reviewed. The patient noticed what she felt like was a firm area underneath the breast. She asked to be seen today regarding this.  PHYSICAL EXAMINATION: weight is 237 lb (107.502 kg). Her blood pressure is 154/88 and her pulse is 88. Her respiration is 16.      on exam the patient was unable to easily find this area. She felt like there was a firm area in the medial aspect of the inframammary fold. I do not feel anything suspicious on  exam.  ASSESSMENT: The patient is doing satisfactorily with treatment.  PLAN: We will continue with the patient's radiation treatment as planned. The patient knows to let us know of the tonsil tender. Currently there are no associated symptoms discomfort. She will also let us know if this area enlarges.

## 2013-06-05 NOTE — Progress Notes (Signed)
Patient presented to the clinic today following second radiation treatment requesting to see Dr. Mitzi Hansen for hardness under left breast. Patient reports that she felt this hardness this morning. Denies pain at this time. Denies nipple discharge. No edema of left arm noted. Denies tingling or numbness of left arm and breast.

## 2013-06-06 ENCOUNTER — Encounter: Payer: Self-pay | Admitting: Genetic Counselor

## 2013-06-06 ENCOUNTER — Ambulatory Visit (HOSPITAL_BASED_OUTPATIENT_CLINIC_OR_DEPARTMENT_OTHER): Payer: BC Managed Care – PPO | Admitting: Genetic Counselor

## 2013-06-06 ENCOUNTER — Other Ambulatory Visit: Payer: BC Managed Care – PPO | Admitting: Lab

## 2013-06-06 ENCOUNTER — Ambulatory Visit
Admission: RE | Admit: 2013-06-06 | Discharge: 2013-06-06 | Disposition: A | Discharge status: Home / Self Care | Payer: BC Managed Care – PPO | Source: Ambulatory Visit | Attending: Radiation Oncology | Admitting: Radiation Oncology

## 2013-06-06 ENCOUNTER — Telehealth: Payer: Self-pay | Admitting: Radiation Oncology

## 2013-06-06 DIAGNOSIS — IMO0002 Reserved for concepts with insufficient information to code with codable children: Secondary | ICD-10-CM

## 2013-06-06 DIAGNOSIS — D0590 Unspecified type of carcinoma in situ of unspecified breast: Secondary | ICD-10-CM

## 2013-06-06 DIAGNOSIS — C50112 Malignant neoplasm of central portion of left female breast: Secondary | ICD-10-CM

## 2013-06-06 NOTE — Telephone Encounter (Signed)
Faxed FUP 05/22/13 to [employer].  OK per [facility].  Received confirmation.

## 2013-06-06 NOTE — Progress Notes (Signed)
Dr.  Drue Second requested a consultation for genetic counseling and risk assessment for Erica Roberts, a 42 y.o. female, for discussion of her personal history of breast cancer.  She presents to clinic today to discuss the possibility of a genetic predisposition to cancer, and to further clarify her risks, as well as her family members' risks for cancer.   HISTORY OF PRESENT ILLNESS: In September 2014, at the age of 69, Erica Roberts was diagnosed with DCIS of the breast. This was treated with lumpectomy and radiation.  The tumor is ER+/PR+.    Past Medical History  Diagnosis Date  . Hypertension   . Heartburn   . Anemia   . Wears glasses   . Breast cancer 03/28/13    Left breast biopsy    Past Surgical History  Procedure Laterality Date  . Mouth surgery    . Cesarean section  1991  . Laparoscopic hysterectomy N/A 03/04/2013    Procedure: HYSTERECTOMY TOTAL LAPAROSCOPIC;  Surgeon: Purcell Nails, MD;  Location: [facility];  Service: Gynecology;  Laterality: N/A;  TLH possible LAVH possiblel TAH, Cystoscopy  . Cystoscopy Bilateral 03/04/2013    Procedure: CYSTOSCOPY;  Surgeon: Purcell Nails, MD;  Location: [facility];  Service: Gynecology;  Laterality: Bilateral;  . Lipoma excision  2005    lt shoulder  . Breast lumpectomy with needle localization Left 05/02/2013    Procedure: BREAST LUMPECTOMY WITH NEEDLE LOCALIZATION;  Surgeon: Maisie Fus A. Cornett, MD;  Location: [facility];  Service: General;  Laterality: Left;  NL BCG 11  . Abdominal hysterectomy  03/04/13    History   Social History  . Marital Status: Divorced    Spouse Name: N/A    Number of Children: 1  . Years of Education: N/A   Occupational History  .     Social History Main Topics  . Smoking status: Never Smoker   . Smokeless tobacco: Not on file  . Alcohol Use: Yes     Comment: occasionally  . Drug Use: No  . Sexual Activity: Not Currently   Other Topics Concern  . Not on file    Social History Narrative  . No narrative on file    REPRODUCTIVE HISTORY AND PERSONAL RISK ASSESSMENT FACTORS: Menarche was at age 71.   premenopausal Uterus Intact: no Ovaries Intact: yes G2P1A1, first live birth at age 25  She has not previously undergone treatment for infertility.   Oral Contraceptive use: 35 years   She has not used HRT in the past.    FAMILY HISTORY:  We obtained a detailed, 4-generation family history.  Significant diagnoses are listed below: Family History  Problem Relation Age of Onset  . Heart attack Father   . Heart attack Maternal Grandfather   The patient does not keep in contact with her father and does not have any health information about her paternal side of the family, including her father and half siblings.  Patient's maternal ancestors are of [place] descent, and paternal ancestors are of African American descent. There is no reported Ashkenazi Jewish ancestry. There is no known consanguinity.  GENETIC COUNSELING ASSESSMENT: Erica Roberts is a 42 y.o. female with a personal history of breast cancer which somewhat suggestive of a hereditary cancer syndrome and predisposition to cancer. We, therefore, discussed and recommended the following at today's visit.   DISCUSSION: We reviewed the characteristics, features and inheritance patterns of hereditary cancer syndromes. We also discussed genetic  testing, including the appropriate family members to test, the process of testing, insurance coverage and turn-around-time for results. We reviewed hereditary cancer syndromes and how we may change her medical management based on the NCCN guidelines.  We reviewed who else in the family would need testing if she were positive, but that if she were negative we would recommend that her daughter be screened for breast cancer 10 years younger than her diagnosis.    PLAN: After considering the risks, benefits, and limitations,  Erica Roberts provided informed consent to pursue genetic testing and the blood sample will be sent to [facility] for analysis of the Breast/Ovarian Cancer panel. We discussed the implications of a positive, negative and/ or variant of uncertain significance genetic test result. Results should be available within approximately 3 weeks' time, at which point they will be disclosed by telephone to [facility], as will any additional recommendations warranted by these results. Erica Roberts will receive a summary of her genetic counseling visit and a copy of her results once available. This information will also be available in Epic. We encouraged Erica Roberts to remain in contact with cancer genetics annually so that we can continuously update the family history and inform her of any changes in cancer genetics and testing that may be of benefit for her family. Erica Roberts's questions were answered to her satisfaction today. Our contact information was provided should additional questions or concerns arise.  The patient was seen for a total of 60 minutes, greater than 50% of which was spent face-to-face counseling.  This note will also be sent to the referring provider via the electronic medical record. The patient will be supplied with a summary of this genetic counseling discussion as well as educational information on the discussed hereditary cancer syndromes following the conclusion of their visit.   Patient was discussed with Dr. Drue Second.   _______________________________________________________________________ For Office Staff:  Number of people involved in session: 1 Was an Intern/ student involved with case: no

## 2013-06-07 ENCOUNTER — Encounter: Payer: Self-pay | Admitting: Radiation Oncology

## 2013-06-07 ENCOUNTER — Ambulatory Visit
Admission: RE | Admit: 2013-06-07 | Discharge: 2013-06-07 | Disposition: A | Discharge status: Home / Self Care | Payer: BC Managed Care – PPO | Source: Ambulatory Visit | Attending: Radiation Oncology | Admitting: Radiation Oncology

## 2013-06-07 VITALS — BP 148/87 | HR 74 | Temp 98.5°F | Resp 20 | Wt 235.7 lb

## 2013-06-07 DIAGNOSIS — C50112 Malignant neoplasm of central portion of left female breast: Secondary | ICD-10-CM

## 2013-06-07 MED ORDER — ALRA NON-METALLIC DEODORANT (RAD-ONC)
1.0000 "application " | Freq: Once | TOPICAL | Status: AC
  Administered 2013-06-07: 1 via TOPICAL

## 2013-06-07 MED ORDER — RADIAPLEXRX EX GEL
Freq: Once | CUTANEOUS | Status: AC
  Administered 2013-06-07: 12:00:00 via TOPICAL

## 2013-06-07 NOTE — Progress Notes (Signed)
Weekly rad txs, lt breast  4/33  Patient educcation done, radiaplex and alra ,rad book given, no c/o pain, skin intact, no skin changes as yet, discussed skin iritation, fatigue,pain, diet Teach back given .Erica Roberts

## 2013-06-10 ENCOUNTER — Ambulatory Visit
Admission: RE | Admit: 2013-06-10 | Discharge: 2013-06-10 | Disposition: A | Discharge status: Home / Self Care | Payer: BC Managed Care – PPO | Source: Ambulatory Visit | Attending: Radiation Oncology | Admitting: Radiation Oncology

## 2013-06-11 ENCOUNTER — Ambulatory Visit
Admission: RE | Admit: 2013-06-11 | Discharge: 2013-06-11 | Disposition: A | Discharge status: Home / Self Care | Payer: BC Managed Care – PPO | Source: Ambulatory Visit | Attending: Radiation Oncology | Admitting: Radiation Oncology

## 2013-06-12 ENCOUNTER — Ambulatory Visit
Admission: RE | Admit: 2013-06-12 | Discharge: 2013-06-12 | Disposition: A | Discharge status: Home / Self Care | Payer: BC Managed Care – PPO | Source: Ambulatory Visit | Attending: Radiation Oncology | Admitting: Radiation Oncology

## 2013-06-13 ENCOUNTER — Ambulatory Visit
Admission: RE | Admit: 2013-06-13 | Discharge: 2013-06-13 | Disposition: A | Discharge status: Home / Self Care | Payer: BC Managed Care – PPO | Source: Ambulatory Visit | Attending: Radiation Oncology | Admitting: Radiation Oncology

## 2013-06-13 NOTE — Addendum Note (Signed)
Encounter addended by: Jonna Coup, MD on: 06/13/2013  8:32 PM<BR>     Documentation filed: Visit Diagnoses

## 2013-06-14 ENCOUNTER — Ambulatory Visit
Admission: RE | Admit: 2013-06-14 | Discharge: 2013-06-14 | Disposition: A | Discharge status: Home / Self Care | Payer: BC Managed Care – PPO | Source: Ambulatory Visit | Attending: Radiation Oncology | Admitting: Radiation Oncology

## 2013-06-14 ENCOUNTER — Telehealth: Payer: Self-pay | Admitting: Oncology

## 2013-06-14 VITALS — BP 143/95 | HR 87 | Temp 98.4°F

## 2013-06-14 DIAGNOSIS — C50112 Malignant neoplasm of central portion of left female breast: Secondary | ICD-10-CM

## 2013-06-14 MED ORDER — RADIAPLEXRX EX GEL
Freq: Once | CUTANEOUS | Status: AC
  Administered 2013-06-14: 13:00:00 via TOPICAL

## 2013-06-14 NOTE — Progress Notes (Signed)
Erica Roberts has received 9 fractions to her left breast.  She denies any pain, but states she notes "some" fatigue. Note mild hyperpigmentation on the left breast, but skin soft and intact.

## 2013-06-14 NOTE — Progress Notes (Signed)
   Department of Radiation Oncology  Phone:  [phone] Fax:        [phone]  Weekly Treatment Note    Name: Erica Roberts Date: 06/14/2013 MRN: [id number] DOB: 1971/01/15   Current dose: 16.2 Gy  Current fraction: 9   MEDICATIONS: Current Outpatient Prescriptions  Medication Sig Dispense Refill  . hyaluronate sodium (RADIAPLEXRX) GEL Apply 1 application topically once.      Marland Kitchen ibuprofen (ADVIL,MOTRIN) 600 MG tablet 1 po pc every 6 hours for 5 days then prn-pain  30 tablet  1  . non-metallic deodorant (ALRA) MISC Apply 1 application topically daily as needed.      Marland Kitchen olmesartan-hydrochlorothiazide (BENICAR HCT) 40-25 MG per tablet Take 1 tablet by mouth daily.      . ondansetron (ZOFRAN) 4 MG tablet Take 1 tablet (4 mg total) by mouth every 8 (eight) hours as needed for nausea.  20 tablet  0  . oxyCODONE-acetaminophen (ROXICET) 5-325 MG per tablet Take 1 tablet by mouth every 4 (four) hours as needed for pain.  30 tablet  0   No current facility-administered medications for this encounter.     ALLERGIES: Review of patient's allergies indicates no known allergies.   LABORATORY DATA:  Lab Results  Component Value Date   WBC 7.6 04/03/2013   HGB 12.6 05/02/2013   HCT 34.4* 04/03/2013   MCV 88.2 04/03/2013   PLT 243 04/03/2013   Lab Results  Component Value Date   NA 142 04/03/2013   K 4.3 04/03/2013   CL 106 02/28/2013   CO2 29 04/03/2013   Lab Results  Component Value Date   ALT 12 04/03/2013   AST 12 04/03/2013   ALKPHOS 73 04/03/2013   BILITOT 0.54 04/03/2013     NARRATIVE: Erica Roberts was seen today for weekly treatment management. The chart was checked and the patient's films were reviewed. The patient is doing well this week. She describes a little bit of fatigue. No major changes in her skin.  PHYSICAL EXAMINATION: temperature is 98.4 F (36.9 C). Her blood pressure is 143/95 and her pulse is 87.        ASSESSMENT: The patient is doing  satisfactorily with treatment.  PLAN: We will continue with the patient's radiation treatment as planned.

## 2013-06-14 NOTE — Addendum Note (Signed)
Encounter addended by: Delynn Flavin, RN on: 06/14/2013  3:38 PM<BR>     Documentation filed: Orders

## 2013-06-14 NOTE — Addendum Note (Signed)
Encounter addended by: Delynn Flavin, RN on: 06/14/2013  3:41 PM<BR>     Documentation filed: Inpatient MAR

## 2013-06-16 ENCOUNTER — Ambulatory Visit
Admission: RE | Admit: 2013-06-16 | Discharge: 2013-06-16 | Disposition: A | Discharge status: Home / Self Care | Payer: BC Managed Care – PPO | Source: Ambulatory Visit | Attending: Radiation Oncology | Admitting: Radiation Oncology

## 2013-06-17 ENCOUNTER — Ambulatory Visit
Admission: RE | Admit: 2013-06-17 | Discharge: 2013-06-17 | Disposition: A | Discharge status: Home / Self Care | Payer: BC Managed Care – PPO | Source: Ambulatory Visit | Attending: Radiation Oncology | Admitting: Radiation Oncology

## 2013-06-17 ENCOUNTER — Encounter: Payer: Self-pay | Admitting: Radiation Oncology

## 2013-06-17 VITALS — BP 135/87 | HR 86 | Temp 98.7°F | Resp 20 | Wt 233.8 lb

## 2013-06-17 DIAGNOSIS — C50112 Malignant neoplasm of central portion of left female breast: Secondary | ICD-10-CM

## 2013-06-17 NOTE — Progress Notes (Signed)
Weekly rad txs lt breast 11/33 txs, slight hyperpigmenetation under axilla, and breast,skin intact,using radiaplex bid 10:39 AM

## 2013-06-17 NOTE — Progress Notes (Signed)
   Department of Radiation Oncology  Phone:  [phone] Fax:        [phone]  Weekly Treatment Note    Name: Erica Roberts Date: 06/17/2013 MRN: [id number] DOB: 1970-09-02   Current dose: 19.8 Gy  Current fraction: 11   MEDICATIONS: Current Outpatient Prescriptions  Medication Sig Dispense Refill  . hyaluronate sodium (RADIAPLEXRX) GEL Apply 1 application topically once.      Marland Kitchen ibuprofen (ADVIL,MOTRIN) 600 MG tablet 1 po pc every 6 hours for 5 days then prn-pain  30 tablet  1  . non-metallic deodorant (ALRA) MISC Apply 1 application topically daily as needed.      Marland Kitchen olmesartan-hydrochlorothiazide (BENICAR HCT) 40-25 MG per tablet Take 1 tablet by mouth daily.      . ondansetron (ZOFRAN) 4 MG tablet Take 1 tablet (4 mg total) by mouth every 8 (eight) hours as needed for nausea.  20 tablet  0  . oxyCODONE-acetaminophen (ROXICET) 5-325 MG per tablet Take 1 tablet by mouth every 4 (four) hours as needed for pain.  30 tablet  0   No current facility-administered medications for this encounter.     ALLERGIES: Review of patient's allergies indicates no known allergies.   LABORATORY DATA:  Lab Results  Component Value Date   WBC 7.6 04/03/2013   HGB 12.6 05/02/2013   HCT 34.4* 04/03/2013   MCV 88.2 04/03/2013   PLT 243 04/03/2013   Lab Results  Component Value Date   NA 142 04/03/2013   K 4.3 04/03/2013   CL 106 02/28/2013   CO2 29 04/03/2013   Lab Results  Component Value Date   ALT 12 04/03/2013   AST 12 04/03/2013   ALKPHOS 73 04/03/2013   BILITOT 0.54 04/03/2013     NARRATIVE: Erica Roberts was seen today for weekly treatment management. The chart was checked and the patient's films were reviewed. The patient states that she is doing well. No skin irritation but she is notice. A little bit of darkening of the skin.  PHYSICAL EXAMINATION: weight is 233 lb 12.8 oz (106.051 kg). Her oral temperature is 98.7 F (37.1 C). Her blood pressure is 135/87 and  her pulse is 86. Her respiration is 20.      some emerging mild hyperpigmentation in the treatment area. No desquamation.  ASSESSMENT: The patient is doing satisfactorily with treatment.  PLAN: We will continue with the patient's radiation treatment as planned.

## 2013-06-18 ENCOUNTER — Ambulatory Visit
Admission: RE | Admit: 2013-06-18 | Discharge: 2013-06-18 | Disposition: A | Discharge status: Home / Self Care | Payer: BC Managed Care – PPO | Source: Ambulatory Visit | Attending: Radiation Oncology | Admitting: Radiation Oncology

## 2013-06-19 ENCOUNTER — Telehealth: Payer: Self-pay | Admitting: Genetic Counselor

## 2013-06-19 ENCOUNTER — Ambulatory Visit
Admission: RE | Admit: 2013-06-19 | Discharge: 2013-06-19 | Disposition: A | Discharge status: Home / Self Care | Payer: BC Managed Care – PPO | Source: Ambulatory Visit | Attending: Radiation Oncology | Admitting: Radiation Oncology

## 2013-06-19 NOTE — Telephone Encounter (Signed)
Left good news message on VM and asked that she call me back.

## 2013-06-21 ENCOUNTER — Encounter: Payer: Self-pay | Admitting: Genetic Counselor

## 2013-06-21 ENCOUNTER — Telehealth: Payer: Self-pay | Admitting: Genetic Counselor

## 2013-06-21 NOTE — Telephone Encounter (Signed)
Revealed negative genetic testing on breast/ovarian cancer panel. 

## 2013-06-24 ENCOUNTER — Ambulatory Visit
Admission: RE | Admit: 2013-06-24 | Discharge: 2013-06-24 | Disposition: A | Discharge status: Home / Self Care | Payer: BC Managed Care – PPO | Source: Ambulatory Visit | Attending: Radiation Oncology | Admitting: Radiation Oncology

## 2013-06-25 ENCOUNTER — Ambulatory Visit
Admission: RE | Admit: 2013-06-25 | Discharge: 2013-06-25 | Disposition: A | Discharge status: Home / Self Care | Payer: BC Managed Care – PPO | Source: Ambulatory Visit | Attending: Radiation Oncology | Admitting: Radiation Oncology

## 2013-06-26 ENCOUNTER — Ambulatory Visit
Admission: RE | Admit: 2013-06-26 | Discharge: 2013-06-26 | Disposition: A | Discharge status: Home / Self Care | Payer: BC Managed Care – PPO | Source: Ambulatory Visit | Attending: Radiation Oncology | Admitting: Radiation Oncology

## 2013-06-27 ENCOUNTER — Ambulatory Visit
Admission: RE | Admit: 2013-06-27 | Discharge: 2013-06-27 | Disposition: A | Discharge status: Home / Self Care | Payer: BC Managed Care – PPO | Source: Ambulatory Visit | Attending: Radiation Oncology | Admitting: Radiation Oncology

## 2013-06-27 ENCOUNTER — Ambulatory Visit
Admission: RE | Admit: 2013-06-27 | Payer: BC Managed Care – PPO | Source: Ambulatory Visit | Admitting: Radiation Oncology

## 2013-06-28 ENCOUNTER — Ambulatory Visit
Admission: RE | Admit: 2013-06-28 | Discharge: 2013-06-28 | Disposition: A | Discharge status: Home / Self Care | Payer: BC Managed Care – PPO | Source: Ambulatory Visit | Attending: Radiation Oncology | Admitting: Radiation Oncology

## 2013-06-28 ENCOUNTER — Encounter: Payer: Self-pay | Admitting: Radiation Oncology

## 2013-06-28 ENCOUNTER — Ambulatory Visit: Payer: BC Managed Care – PPO | Admitting: Radiation Oncology

## 2013-06-28 VITALS — BP 153/86 | HR 75 | Temp 99.4°F | Resp 20 | Ht 68.5 in | Wt 235.6 lb

## 2013-06-28 DIAGNOSIS — C50112 Malignant neoplasm of central portion of left female breast: Secondary | ICD-10-CM

## 2013-06-28 MED ORDER — RADIAPLEXRX EX GEL
Freq: Once | CUTANEOUS | Status: AC
  Administered 2013-06-28: 13:00:00 via TOPICAL

## 2013-06-28 NOTE — Progress Notes (Signed)
  Radiation Oncology         ([phone] ________________________________  Name: Erica Roberts MRN: [id number]  Date: 06/28/2013  DOB: [birth year]  Weekly Radiation Therapy Management  Current Dose: 32.4 Gy     Planned Dose:  50.4 Gy  Narrative . . . . . . . . The patient presents for routine under treatment assessment.                                  Weekly rad txs left breast 18/33 completed, no c/o pain, hyperpigmentation, dry desquamation under axilla, under inframmary fold skin thinning, but intact, no c/o pain, using radiaplex bid  The patient is without complaint.                                 Set-up films were reviewed.                                 The chart was checked. Physical Findings. . .  height is 5' 8.5" (1.74 m) and weight is 235 lb 9.6 oz (106.867 kg). Her oral temperature is 99.4 F (37.4 C). Her blood pressure is 153/86 and her pulse is 75. Her respiration is 20. . Weight essentially stable.  No significant changes. Impression . . . . . . . The patient is tolerating radiation. Plan . . . . . . . . . . . . Continue treatment as planned.  ________________________________  Artist Pais. Kathrynn Running, M.D.

## 2013-06-28 NOTE — Addendum Note (Signed)
Encounter addended by: Lowella Petties, RN on: 06/28/2013  1:24 PM<BR>     Documentation filed: Inpatient MAR

## 2013-06-28 NOTE — Addendum Note (Signed)
Encounter addended by: Lowella Petties, RN on: 06/28/2013  1:22 PM<BR>     Documentation filed: Orders

## 2013-06-28 NOTE — Progress Notes (Signed)
Weekly rad txs left breast 18/33 completed, no c/o pain, hyperpigmentation, dry desquamation under axilla, under inframmary fold skin thinning, but intact, no c/o pain, using radiaplex bid 10:21 AM

## 2013-07-01 ENCOUNTER — Ambulatory Visit
Admission: RE | Admit: 2013-07-01 | Discharge: 2013-07-01 | Disposition: A | Discharge status: Home / Self Care | Payer: BC Managed Care – PPO | Source: Ambulatory Visit | Attending: Radiation Oncology | Admitting: Radiation Oncology

## 2013-07-02 ENCOUNTER — Ambulatory Visit
Admission: RE | Admit: 2013-07-02 | Discharge: 2013-07-02 | Disposition: A | Discharge status: Home / Self Care | Payer: BC Managed Care – PPO | Source: Ambulatory Visit | Attending: Radiation Oncology | Admitting: Radiation Oncology

## 2013-07-03 ENCOUNTER — Ambulatory Visit: Payer: BC Managed Care – PPO

## 2013-07-04 ENCOUNTER — Ambulatory Visit: Payer: BC Managed Care – PPO

## 2013-07-05 ENCOUNTER — Ambulatory Visit
Admission: RE | Admit: 2013-07-05 | Discharge: 2013-07-05 | Disposition: A | Discharge status: Home / Self Care | Payer: BC Managed Care – PPO | Source: Ambulatory Visit | Attending: Radiation Oncology | Admitting: Radiation Oncology

## 2013-07-05 VITALS — BP 129/85 | HR 93 | Temp 99.2°F | Ht 68.5 in | Wt 238.0 lb

## 2013-07-05 DIAGNOSIS — C50112 Malignant neoplasm of central portion of left female breast: Secondary | ICD-10-CM

## 2013-07-05 MED ORDER — RADIAPLEXRX EX GEL
Freq: Once | CUTANEOUS | Status: AC
  Administered 2013-07-05: 15:00:00 via TOPICAL

## 2013-07-05 MED ORDER — ALRA NON-METALLIC DEODORANT (RAD-ONC)
1.0000 "application " | Freq: Once | TOPICAL | Status: AC
  Administered 2013-07-05: 1 via TOPICAL

## 2013-07-05 NOTE — Progress Notes (Signed)
   Department of Radiation Oncology  Phone:  [phone] Fax:        [phone]  Weekly Treatment Note    Name: Erica Roberts Date: 07/05/2013 MRN: [id number] DOB: 17-Apr-1971   Current dose: 37.8 Gy  Current fraction: 21   MEDICATIONS: Current Outpatient Prescriptions  Medication Sig Dispense Refill  . hyaluronate sodium (RADIAPLEXRX) GEL Apply 1 application topically once.      . non-metallic deodorant Thornton Papas) MISC Apply 1 application topically daily as needed.      Marland Kitchen olmesartan-hydrochlorothiazide (BENICAR HCT) 40-25 MG per tablet Take 1 tablet by mouth daily.      Marland Kitchen ibuprofen (ADVIL,MOTRIN) 600 MG tablet 1 po pc every 6 hours for 5 days then prn-pain  30 tablet  1  . ondansetron (ZOFRAN) 4 MG tablet Take 1 tablet (4 mg total) by mouth every 8 (eight) hours as needed for nausea.  20 tablet  0  . oxyCODONE-acetaminophen (ROXICET) 5-325 MG per tablet Take 1 tablet by mouth every 4 (four) hours as needed for pain.  30 tablet  0   Current Facility-Administered Medications  Medication Dose Route Frequency Provider Last Rate Last Dose  . hyaluronate sodium (RADIAPLEXRX) gel   Topical Once Billie Lade, MD      . non-metallic deodorant Thornton Papas) 1 application  1 application Topical Once Billie Lade, MD         ALLERGIES: Review of patient's allergies indicates no known allergies.   LABORATORY DATA:  Lab Results  Component Value Date   WBC 7.6 04/03/2013   HGB 12.6 05/02/2013   HCT 34.4* 04/03/2013   MCV 88.2 04/03/2013   PLT 243 04/03/2013   Lab Results  Component Value Date   NA 142 04/03/2013   K 4.3 04/03/2013   CL 106 02/28/2013   CO2 29 04/03/2013   Lab Results  Component Value Date   ALT 12 04/03/2013   AST 12 04/03/2013   ALKPHOS 73 04/03/2013   BILITOT 0.54 04/03/2013     NARRATIVE: Erica Roberts was seen today for weekly treatment management. The chart was checked and the patient's films were reviewed. The patient is doing well with treatment.  Minor irritation so far.  PHYSICAL EXAMINATION: height is 5' 8.5" (1.74 m) and weight is 238 lb (107.956 kg). Her temperature is 99.2 F (37.3 C). Her blood pressure is 129/85 and her pulse is 93.      diffuse hyperpigmentation. Some desquamation emerging in the axilla. No moist desquamation. Overall her skin looks good.  ASSESSMENT: The patient is doing satisfactorily with treatment.  PLAN: We will continue with the patient's radiation treatment as planned.

## 2013-07-05 NOTE — Addendum Note (Signed)
Encounter addended by: Eduardo Osier, RN on: 07/05/2013  2:38 PM<BR>     Documentation filed: Inpatient MAR

## 2013-07-05 NOTE — Progress Notes (Signed)
Erica Roberts has had 21 fractions to her left breast.  She denies pain and fatigue.  The skin on her left breast and underarm has hyperpigmentation.  She also had areas of peeling under her left arm and under her left breast.  She is using radiaplex gel 2-3 times per day.  She is requesting a refill on radiaplex and alra.  Refill has been given.

## 2013-07-08 ENCOUNTER — Ambulatory Visit
Admission: RE | Admit: 2013-07-08 | Discharge: 2013-07-08 | Disposition: A | Discharge status: Home / Self Care | Payer: BC Managed Care – PPO | Source: Ambulatory Visit | Attending: Radiation Oncology | Admitting: Radiation Oncology

## 2013-07-09 ENCOUNTER — Ambulatory Visit
Admission: RE | Admit: 2013-07-09 | Discharge: 2013-07-09 | Disposition: A | Discharge status: Home / Self Care | Payer: BC Managed Care – PPO | Source: Ambulatory Visit | Attending: Radiation Oncology | Admitting: Radiation Oncology

## 2013-07-10 ENCOUNTER — Ambulatory Visit
Admission: RE | Admit: 2013-07-10 | Discharge: 2013-07-10 | Disposition: A | Discharge status: Home / Self Care | Payer: BC Managed Care – PPO | Source: Ambulatory Visit | Attending: Radiation Oncology | Admitting: Radiation Oncology

## 2013-07-11 ENCOUNTER — Ambulatory Visit
Admission: RE | Admit: 2013-07-11 | Discharge: 2013-07-11 | Disposition: A | Discharge status: Home / Self Care | Payer: BC Managed Care – PPO | Source: Ambulatory Visit | Attending: Radiation Oncology | Admitting: Radiation Oncology

## 2013-07-12 ENCOUNTER — Ambulatory Visit
Admission: RE | Admit: 2013-07-12 | Discharge: 2013-07-12 | Disposition: A | Discharge status: Home / Self Care | Payer: BC Managed Care – PPO | Source: Ambulatory Visit | Attending: Radiation Oncology | Admitting: Radiation Oncology

## 2013-07-12 ENCOUNTER — Ambulatory Visit
Admission: RE | Admit: 2013-07-12 | Payer: BC Managed Care – PPO | Source: Ambulatory Visit | Admitting: Radiation Oncology

## 2013-07-12 ENCOUNTER — Encounter: Payer: Self-pay | Admitting: Radiation Oncology

## 2013-07-12 VITALS — BP 158/102 | HR 93 | Temp 98.7°F | Resp 20 | Wt 234.5 lb

## 2013-07-12 DIAGNOSIS — C50112 Malignant neoplasm of central portion of left female breast: Secondary | ICD-10-CM

## 2013-07-12 MED ORDER — RADIAPLEXRX EX GEL
Freq: Once | CUTANEOUS | Status: AC
  Administered 2013-07-12: 10:00:00 via TOPICAL

## 2013-07-12 NOTE — Progress Notes (Signed)
   Department of Radiation Oncology  Phone:  [phone] Fax:        [phone]  Weekly Treatment Note    Name: Erica Roberts Date: 07/12/2013 MRN: [id number] DOB: 23-Aug-1970   Current dose: 46.8 Gy  Current fraction: 26   MEDICATIONS: Current Outpatient Prescriptions  Medication Sig Dispense Refill  . hyaluronate sodium (RADIAPLEXRX) GEL Apply 1 application topically once.      Marland Kitchen ibuprofen (ADVIL,MOTRIN) 600 MG tablet 1 po pc every 6 hours for 5 days then prn-pain  30 tablet  1  . non-metallic deodorant (ALRA) MISC Apply 1 application topically daily as needed.      Marland Kitchen olmesartan-hydrochlorothiazide (BENICAR HCT) 40-25 MG per tablet Take 1 tablet by mouth daily.      . ondansetron (ZOFRAN) 4 MG tablet Take 1 tablet (4 mg total) by mouth every 8 (eight) hours as needed for nausea.  20 tablet  0  . oxyCODONE-acetaminophen (ROXICET) 5-325 MG per tablet Take 1 tablet by mouth every 4 (four) hours as needed for pain.  30 tablet  0   No current facility-administered medications for this encounter.     ALLERGIES: Review of patient's allergies indicates no known allergies.   LABORATORY DATA:  Lab Results  Component Value Date   WBC 7.6 04/03/2013   HGB 12.6 05/02/2013   HCT 34.4* 04/03/2013   MCV 88.2 04/03/2013   PLT 243 04/03/2013   Lab Results  Component Value Date   NA 142 04/03/2013   K 4.3 04/03/2013   CL 106 02/28/2013   CO2 29 04/03/2013   Lab Results  Component Value Date   ALT 12 04/03/2013   AST 12 04/03/2013   ALKPHOS 73 04/03/2013   BILITOT 0.54 04/03/2013     NARRATIVE: Erica Roberts was seen today for weekly treatment management. The chart was checked and the patient's films were reviewed. The patient is doing well. No real skin irritation although she has noticed significant increased hyperpigmentation.  PHYSICAL EXAMINATION: weight is 234 lb 8 oz (106.369 kg). Her oral temperature is 98.7 F (37.1 C). Her blood pressure is 158/102 and her  pulse is 93. Her respiration is 20.      the patient's scan in the treatment area looks quite good. Diffuse hyperpigmentation present. Some early desquamation, dry, in the axilla.  ASSESSMENT: The patient is doing satisfactorily with treatment.  I'm very pleased with how the patient's skin looks.  PLAN: We will continue with the patient's radiation treatment as planned.

## 2013-07-12 NOTE — Progress Notes (Signed)
Weekly rad txs 26 left breast completed, hyperpigmentation ,under axilla dry, skin intact, radiaplex bid , appetite good, no pain 10:15 AM

## 2013-07-15 ENCOUNTER — Encounter: Payer: Self-pay | Admitting: Radiation Oncology

## 2013-07-15 ENCOUNTER — Ambulatory Visit
Admission: RE | Admit: 2013-07-15 | Discharge: 2013-07-15 | Disposition: A | Discharge status: Home / Self Care | Payer: BC Managed Care – PPO | Source: Ambulatory Visit | Attending: Radiation Oncology | Admitting: Radiation Oncology

## 2013-07-16 ENCOUNTER — Ambulatory Visit: Payer: BC Managed Care – PPO

## 2013-07-16 ENCOUNTER — Ambulatory Visit: Payer: BC Managed Care – PPO | Admitting: Radiation Oncology

## 2013-07-16 ENCOUNTER — Ambulatory Visit
Admission: RE | Admit: 2013-07-16 | Discharge: 2013-07-16 | Disposition: A | Discharge status: Home / Self Care | Payer: BC Managed Care – PPO | Source: Ambulatory Visit | Attending: Radiation Oncology | Admitting: Radiation Oncology

## 2013-07-17 ENCOUNTER — Ambulatory Visit
Admission: RE | Admit: 2013-07-17 | Discharge: 2013-07-17 | Disposition: A | Discharge status: Home / Self Care | Payer: BC Managed Care – PPO | Source: Ambulatory Visit | Attending: Radiation Oncology | Admitting: Radiation Oncology

## 2013-07-18 ENCOUNTER — Ambulatory Visit: Payer: BC Managed Care – PPO

## 2013-07-19 ENCOUNTER — Ambulatory Visit
Admission: RE | Admit: 2013-07-19 | Discharge: 2013-07-19 | Disposition: A | Discharge status: Home / Self Care | Payer: BC Managed Care – PPO | Source: Ambulatory Visit | Attending: Radiation Oncology | Admitting: Radiation Oncology

## 2013-07-19 ENCOUNTER — Encounter: Payer: Self-pay | Admitting: Radiation Oncology

## 2013-07-19 ENCOUNTER — Ambulatory Visit: Payer: BC Managed Care – PPO

## 2013-07-19 VITALS — BP 147/81 | HR 86 | Temp 97.6°F | Resp 20 | Wt 239.1 lb

## 2013-07-19 DIAGNOSIS — C50112 Malignant neoplasm of central portion of left female breast: Secondary | ICD-10-CM

## 2013-07-19 MED ORDER — RADIAPLEXRX EX GEL
Freq: Once | CUTANEOUS | Status: AC
  Administered 2013-07-19: 12:00:00 via TOPICAL

## 2013-07-19 NOTE — Progress Notes (Signed)
  Radiation Oncology         [phone] ________________________________  Name: Erica Roberts MRN: [id number]  Date: 07/19/2013  DOB: [birth year]  Simulation Verification Note   NARRATIVE: The patient was brought to the treatment unit and placed in the planned treatment position for the patient's boost treatment. The clinical setup was verified. Then port films were obtained and uploaded to the radiation oncology medical record software.  The treatment beams were carefully compared against the planned radiation fields. The position, location, and shape of the radiation fields was reviewed. The targeted volume of tissue appears to be appropriately covered by the radiation beams. Based on my personal review, I approved the simulation verification. The patient's treatment will proceed as planned.  ________________________________   Radene Gunning, MD, PhD

## 2013-07-19 NOTE — Progress Notes (Signed)
Weekly rad txs, 30/33 completed, lt breast, slight itching nipple area, hyperpigmentation only, skin intact, using radiaplex gel 2-3x day, eating well, no pain another tube radiaplex given pt request 11:34 AM

## 2013-07-19 NOTE — Progress Notes (Signed)
  Radiation Oncology         [phone] ________________________________  Name: Erica Roberts MRN: [id number]  Date: 07/19/2013  DOB: 12-16-1970  Weekly Radiation Therapy Management  Current Dose: 54.4 Gy     Planned Dose:  60.4 Gy  Narrative . . . . . . . . The patient presents for routine under treatment assessment.  slight itching nipple area, hyperpigmentation only, skin intact, using radiaplex gel 2-3x day, eating well, no pain another tube radiaplex given pt request                                   The patient is without complaint.                                 Set-up films were reviewed.                                 The chart was checked. Physical Findings. . .  weight is 239 lb 1.6 oz (108.455 kg). Her oral temperature is 97.6 F (36.4 C). Her blood pressure is 147/81 and her pulse is 86. Her respiration is 20. . Weight essentially stable.  No significant changes. Impression . . . . . . . The patient is tolerating radiation. Plan . . . . . . . . . . . . Continue treatment as planned.  ________________________________  Artist Pais. Kathrynn Running, M.D.

## 2013-07-22 ENCOUNTER — Ambulatory Visit
Admission: RE | Admit: 2013-07-22 | Discharge: 2013-07-22 | Disposition: A | Discharge status: Home / Self Care | Payer: BC Managed Care – PPO | Source: Ambulatory Visit | Attending: Radiation Oncology | Admitting: Radiation Oncology

## 2013-07-22 ENCOUNTER — Ambulatory Visit: Payer: BC Managed Care – PPO | Admitting: Oncology

## 2013-07-23 ENCOUNTER — Ambulatory Visit: Payer: BC Managed Care – PPO

## 2013-07-23 ENCOUNTER — Ambulatory Visit
Admission: RE | Admit: 2013-07-23 | Discharge: 2013-07-23 | Disposition: A | Discharge status: Home / Self Care | Payer: BC Managed Care – PPO | Source: Ambulatory Visit | Attending: Radiation Oncology | Admitting: Radiation Oncology

## 2013-07-24 ENCOUNTER — Ambulatory Visit
Admission: RE | Admit: 2013-07-24 | Discharge: 2013-07-24 | Disposition: A | Discharge status: Home / Self Care | Payer: BC Managed Care – PPO | Source: Ambulatory Visit | Attending: Radiation Oncology | Admitting: Radiation Oncology

## 2013-07-24 ENCOUNTER — Ambulatory Visit: Payer: BC Managed Care – PPO

## 2013-07-24 ENCOUNTER — Encounter: Payer: Self-pay | Admitting: Radiation Oncology

## 2013-07-24 VITALS — BP 126/78 | HR 78 | Resp 16 | Wt 233.1 lb

## 2013-07-24 DIAGNOSIS — C50112 Malignant neoplasm of central portion of left female breast: Secondary | ICD-10-CM

## 2013-07-24 MED ORDER — RADIAPLEXRX EX GEL
Freq: Once | CUTANEOUS | Status: AC
  Administered 2013-07-24: 08:00:00 via TOPICAL

## 2013-07-24 NOTE — Progress Notes (Signed)
  Department of Radiation Oncology  Phone:  [phone] Fax:        [phone]  Weekly Treatment Note    Name: Erica Roberts Date: 07/24/2013 MRN: [id number] DOB: 1971-04-15   Current dose: 60.4 Gy  Current fraction: 33   MEDICATIONS: Current Outpatient Prescriptions  Medication Sig Dispense Refill  . hyaluronate sodium (RADIAPLEXRX) GEL Apply 1 application topically once.      Marland Kitchen ibuprofen (ADVIL,MOTRIN) 600 MG tablet 1 po pc every 6 hours for 5 days then prn-pain  30 tablet  1  . non-metallic deodorant (ALRA) MISC Apply 1 application topically daily as needed.      Marland Kitchen olmesartan-hydrochlorothiazide (BENICAR HCT) 40-25 MG per tablet Take 1 tablet by mouth daily.      Marland Kitchen oxyCODONE-acetaminophen (ROXICET) 5-325 MG per tablet Take 1 tablet by mouth every 4 (four) hours as needed for pain.  30 tablet  0  . ondansetron (ZOFRAN) 4 MG tablet Take 1 tablet (4 mg total) by mouth every 8 (eight) hours as needed for nausea.  20 tablet  0   No current facility-administered medications for this encounter.     ALLERGIES: Review of patient's allergies indicates no known allergies.   LABORATORY DATA:  Lab Results  Component Value Date   WBC 7.6 04/03/2013   HGB 12.6 05/02/2013   HCT 34.4* 04/03/2013   MCV 88.2 04/03/2013   PLT 243 04/03/2013   Lab Results  Component Value Date   NA 142 04/03/2013   K 4.3 04/03/2013   CL 106 02/28/2013   CO2 29 04/03/2013   Lab Results  Component Value Date   ALT 12 04/03/2013   AST 12 04/03/2013   ALKPHOS 73 04/03/2013   BILITOT 0.54 04/03/2013     NARRATIVE: Erica Roberts was seen today for weekly treatment management. The chart was checked and the patient's films were reviewed. The patient states that she did very well over the last week. He improved in terms of skin irritation/itching. Overall she is pleased to finish today.  PHYSICAL EXAMINATION: weight is 233 lb 1.6 oz (105.733 kg). Her blood pressure is 126/78 and her pulse is  78. Her respiration is 16.      diffuse significant hyperpigmentation with a little bit of dry desquamation. No moist desquamation currently.  ASSESSMENT: The patient did satisfactorily with treatment.  PLAN: The patient will follow-up in our clinic in 1 month.

## 2013-07-24 NOTE — Progress Notes (Signed)
Hyperpigmentation of left/treated breast noted. Skin intact. Reports using radiaplex gel 2-3 times per day as directed. Provided patient with an additional tube of radiaplex to use over the next two weeks.  Denies fatigue. Excited to go to [employer] to celebrate completing treatment.

## 2013-07-26 ENCOUNTER — Ambulatory Visit: Payer: BC Managed Care – PPO

## 2013-08-02 ENCOUNTER — Ambulatory Visit (HOSPITAL_BASED_OUTPATIENT_CLINIC_OR_DEPARTMENT_OTHER): Payer: BC Managed Care – PPO | Admitting: Oncology

## 2013-08-02 ENCOUNTER — Encounter: Payer: Self-pay | Admitting: Oncology

## 2013-08-02 ENCOUNTER — Telehealth: Payer: Self-pay | Admitting: Oncology

## 2013-08-02 VITALS — BP 139/85 | HR 85 | Temp 98.1°F | Resp 18 | Ht 68.0 in | Wt 235.2 lb

## 2013-08-02 DIAGNOSIS — Z170 Estrogen receptor positive status [ER+]: Secondary | ICD-10-CM

## 2013-08-02 DIAGNOSIS — C50112 Malignant neoplasm of central portion of left female breast: Secondary | ICD-10-CM

## 2013-08-02 DIAGNOSIS — D0590 Unspecified type of carcinoma in situ of unspecified breast: Secondary | ICD-10-CM

## 2013-08-02 MED ORDER — TAMOXIFEN CITRATE 20 MG PO TABS: 20.0000 mg | ORAL_TABLET | Freq: Every day | ORAL | Status: DC

## 2013-08-02 NOTE — Telephone Encounter (Signed)
, °

## 2013-08-02 NOTE — Progress Notes (Signed)
  Radiation Oncology         [phone] ________________________________  Name: Erica Roberts MRN: [id number]  Date: 07/24/2013  DOB: [birth year]  End of Treatment Note  Diagnosis:      Ductal carcinoma in situ of the left breast  Indication for treatment:  Curative       Radiation treatment dates:   06/04/2013 through 07/24/2013  Site/dose:   The patient was treated initially with whole breast tangent fields to a dose of 50.4 gray. She then received a boost for an additional 10 gray to the seroma. This involved a 3 field photon technique.  Narrative: The patient tolerated radiation treatment relatively well.    The patient had some mild to moderate skin irritation during treatment but overall the patient's skin date excellent, especially given the patient's anatomy.   Plan: The patient has completed radiation treatment. The patient will return to radiation oncology clinic for routine followup in one month. I advised the patient to call or return sooner if they have any questions or concerns related to their recovery or treatment. ________________________________  Jodelle Gross, M.D., Ph.D.

## 2013-08-02 NOTE — Progress Notes (Signed)
  Radiation Oncology         [phone] ________________________________  Name: Erica Roberts MRN: [id number]  Date: 06/03/2013  DOB: 1971/02/03  Simulation Verification Note   NARRATIVE: The patient was brought to the treatment unit and placed in the planned treatment position. The clinical setup was verified. Then port films were obtained and uploaded to the radiation oncology medical record software.  The treatment beams were carefully compared against the planned radiation fields. The position, location, and shape of the radiation fields was reviewed. The targeted volume of tissue appears to be appropriately covered by the radiation beams. Based on my personal review, I approved the simulation verification. The patient's treatment will proceed as planned.  ________________________________   Jodelle Gross, MD, PhD

## 2013-08-02 NOTE — Patient Instructions (Signed)

## 2013-08-02 NOTE — Progress Notes (Signed)
Complex simulation note  Diagnosis: Breast cancer  Narrative The patient has initially been planned to receive a course of whole breast radiation to a dose of 50.4 gray in 28 fractions at 1.8 gray per fraction. The patient will now receive an additional boost to the seroma cavity which has been contoured. This will correspond to a boost of 10 gray in 5 fractions at 2 gray per fraction. To accomplish this, an additional 3 customized blocks have been designed for this purpose. A complex isodose plan is requested to ensure that the target area is adequately covered with radiation dose and that the nearby normal structures such as the lung are adequately spared. The patient's final total dose will be 60.4 gray.  ------------------------------------------------  Jodelle Gross, MD, PhD

## 2013-08-06 NOTE — Progress Notes (Signed)
Erica Roberts [id number] 06/05/1971 43 y.o. 08/06/2013 11:50 AM  CC  Ron Parker, MD [address] [address] Dr. Erroll Luna Dr. Kyung Rudd  DIAGNOSIS: 43 year old female with new diagnosis of DCIS of the left breast. Patient is seen in the multidisciplinary breast clinic for discussion of treatment options.   STAGE:   Cancer of central portion of female breast   Primary site: Breast (Left)   Staging method: AJCC 7th Edition   Clinical: Stage 0 (Tis (DCIS), N0, cM0)   Summary: Stage 0 (Tis (DCIS), N0, cM0)  REFERRING PHYSICIAN: Dr. Marcello Moores Cornett  HISTORY OF PRESENT ILLNESS:  Erica Roberts is a 43 y.o. female.    #1 medical history significant for hypertension and anemia.   #2Patient underwent a screening mammogram that revealed suspicious findings. Ultrasound showed a 9 mm lesion her in a biopsy was performed this revealed low grade ductal carcinoma in situ. The receptor studies are revealing the tumor to be ER positive PR positive. MRI is pending. Her case was discussed at the multidisciplinary breast clinic and conference.   #3 s/p RT given between 06/05/13 - 07/24/13  4. Curative intent tamoxifen 20 mg daily x 5 years  CURRENT THERAPY: tamoxifen 20 mg daily begin 07/31/13  INTERVAL HISTORY:  patient is seen in follow up after completion of Rt. She overall tolerated it very well expcept for transient discompfort and erythema. She worked throughot her treatments. She denies any fevers, chills, nause or vomiting, no headaches. Remainder of the 10 point review of systems is negative  Past Medical History: Past Medical History  Diagnosis Date  . Hypertension   . Heartburn   . Anemia   . Wears glasses   . Breast cancer 03/28/13    Left breast biopsy    Past Surgical History: Past Surgical History  Procedure Laterality Date  . Mouth surgery    . Cesarean section  1991  . Laparoscopic hysterectomy N/A 03/04/2013     Procedure: HYSTERECTOMY TOTAL LAPAROSCOPIC;  Surgeon: Delice Lesch, MD;  Location: [facility];  Service: Gynecology;  Laterality: N/A;  TLH possible LAVH possiblel TAH, Cystoscopy  . Cystoscopy Bilateral 03/04/2013    Procedure: CYSTOSCOPY;  Surgeon: Delice Lesch, MD;  Location: [facility];  Service: Gynecology;  Laterality: Bilateral;  . Lipoma excision  2005    lt shoulder  . Breast lumpectomy with needle localization Left 05/02/2013    Procedure: BREAST LUMPECTOMY WITH NEEDLE LOCALIZATION;  Surgeon: Marcello Moores A. Cornett, MD;  Location: [facility];  Service: General;  Laterality: Left;  NL BCG 11  . Abdominal hysterectomy  03/04/13    Family History: Family History  Problem Relation Age of Onset  . Heart attack Father   . Heart attack Maternal Grandfather     Social History History  Substance Use Topics  . Smoking status: Never Smoker   . Smokeless tobacco: Not on file  . Alcohol Use: Yes     Comment: occasionally    Allergies: No Known Allergies  Current Medications: Current Outpatient Prescriptions  Medication Sig Dispense Refill  . hyaluronate sodium (RADIAPLEXRX) GEL Apply 1 application topically once.      Marland Kitchen ibuprofen (ADVIL,MOTRIN) 600 MG tablet 1 po pc every 6 hours for 5 days then prn-pain  30 tablet  1  . non-metallic deodorant (ALRA) MISC Apply 1 application topically daily as needed.      Marland Kitchen olmesartan-hydrochlorothiazide (BENICAR HCT) 40-25 MG per tablet Take 1 tablet by mouth  daily.      . ondansetron (ZOFRAN) 4 MG tablet Take 1 tablet (4 mg total) by mouth every 8 (eight) hours as needed for nausea.  20 tablet  0  . oxyCODONE-acetaminophen (ROXICET) 5-325 MG per tablet Take 1 tablet by mouth every 4 (four) hours as needed for pain.  30 tablet  0  . tamoxifen (NOLVADEX) 20 MG tablet Take 1 tablet (20 mg total) by mouth daily.  90 tablet  12   No current facility-administered medications for this visit.    OB/GYN History:menarche at age 44 she is  premenopausal first live birth at 71. She has completed her family  Fertility Discussion: she has completed her family Prior History of Cancer: no  Health Maintenance:  Colonoscopy no Bone Density no Last PAP smear up-to-date  ECOG PERFORMANCE STATUS: 0 - Asymptomatic  Genetic Counseling/testing: yes  REVIEW OF SYSTEMS:  A comprehensive review of systems was negative.  PHYSICAL EXAMINATION: Blood pressure 139/85, pulse 85, temperature 98.1 F (36.7 C), temperature source Oral, resp. rate 18, height '5\' 8"'  (1.727 m), weight 235 lb 3.2 oz (106.686 kg).  KDT:OIZTI, healthy, no distress, well nourished and well developed SKIN: skin color, texture, turgor are normal, no rashes or significant lesions HEAD: Normocephalic EYES: PERRLA, EOMI, Conjunctiva are pink and non-injected EARS: External ears normal OROPHARYNX:no exudate, no erythema and lips, buccal mucosa, and tongue normal  NECK: no adenopathy, thyroid normal size, non-tender, without nodularity LYMPH:  no palpable lymphadenopathy, no hepatosplenomegaly BREAST:breasts appear normal, no suspicious masses, no skin or nipple changes or axillary nodes LUNGS: clear to auscultation and percussion HEART: regular rate & rhythm, no murmurs and no gallops ABDOMEN:abdomen soft, non-tender, normal bowel sounds and no masses or organomegaly BACK: Back symmetric, no curvature., No CVA tenderness EXTREMITIES:no edema, no clubbing, no cyanosis  NEURO: alert & oriented x 3 with fluent speech, no focal motor/sensory deficits, gait normal     STUDIES/RESULTS: US Breast Left  04-05-13   *RADIOLOGY REPORT*  Clinical Data:  The patient was recalled from screening for left breast mass  DIGITAL DIAGNOSTIC LEFT MAMMOGRAM  AND LEFT BREAST ULTRASOUND:  Comparison:  Prior examination dated 02/27/2013  Findings:  ACR Breast Density Category c:  The breast tissue is heterogeneously dense, which may obscure small masses.  Within the left breast 12  o'clock position anterior depth there is a 1 cm irregular mass which persists on additional spot compression views.  On physical exam, I palpate no discrete mass within the 12 o'clock position left breast.  Ultrasound is performed, showing a 0.9 x 0.7 x 0.8 cm irregular hypoechoic mass with a surrounding echogenic rim.  This is located within the left breast 12 o'clock position 2 cm from the nipple  No left axillary adenopathy.  IMPRESSION:  Suspicious irregular hypoechoic mass within the left breast. Further evaluation with ultrasound-guided core needle biopsy is recommended.  This has been scheduled for 9/10/20014 at 8 am.  RECOMMENDATION: Ultrasound-guided core needle biopsy of suspicious left breast mass (scheduled).  I have discussed the findings and recommendations with the patient. Results were also provided in writing at the conclusion of the visit.  If applicable, a reminder letter Roberts be sent to the patient regarding the next appointment.  BI-RADS CATEGORY 4:  Suspicious abnormality - biopsy should be considered.   Original Report Authenticated By: Lovey Newcomer, M.D   [facility]  04-05-13   *RADIOLOGY REPORT*  Clinical Data:  The patient was recalled from screening for left  breast mass  DIGITAL DIAGNOSTIC LEFT MAMMOGRAM  AND LEFT BREAST ULTRASOUND:  Comparison:  Prior examination dated 02/27/2013  Findings:  ACR Breast Density Category c:  The breast tissue is heterogeneously dense, which may obscure small masses.  Within the left breast 12 o'clock position anterior depth there is a 1 cm irregular mass which persists on additional spot compression views.  On physical exam, I palpate no discrete mass within the 12 o'clock position left breast.  Ultrasound is performed, showing a 0.9 x 0.7 x 0.8 cm irregular hypoechoic mass with a surrounding echogenic rim.  This is located within the left breast 12 o'clock position 2 cm from the nipple  No left axillary adenopathy.  IMPRESSION:  Suspicious  irregular hypoechoic mass within the left breast. Further evaluation with ultrasound-guided core needle biopsy is recommended.  This has been scheduled for 9/10/20014 at 8 am.  RECOMMENDATION: Ultrasound-guided core needle biopsy of suspicious left breast mass (scheduled).  I have discussed the findings and recommendations with the patient. Results were also provided in writing at the conclusion of the visit.  If applicable, a reminder letter Roberts be sent to the patient regarding the next appointment.  BI-RADS CATEGORY 4:  Suspicious abnormality - biopsy should be considered.   Original Report Authenticated By: Lovey Newcomer, M.D   [facility]  03/28/2013   *RADIOLOGY REPORT*  Clinical Data:  Status post ultrasound guided core biopsy of mass in the subareolar region of the left breast.  DIGITAL DIAGNOSTIC LEFT MAMMOGRAM  Comparison:  Previous exams.  Findings:  Mammographic images were obtained following ultrasound guided biopsy of mass in the subareolar region of the left breast. A ribbon shaped clip is identified in the suspicious mass.  IMPRESSION: Tissue marker clip is in expected location after biopsy.  Final Assessment:  Post Procedure Mammograms for Marker Placement   Original Report Authenticated By: Nolon Nations, M.D.   [place] Lt Breast Bx W Loc Dev 1st Lesion Img Bx Spec US Guide  03/29/2013   **ADDENDUM** CREATED: 03/29/2013 13:04:23  Pathology revealed ductal carcinoma in situ.  This is found to be concordant with imaging findings.  I discussed the results over the phone with the patient and answered her questions.  The patient states she is doing well post biopsy without complications.  Recommendations:  MRI of the breasts scheduled for April 03, 2013 8:00 a.m.  [place] clinic on schedule for April 03, 2013.  **END ADDENDUM** SIGNED BY: Abelardo Diesel, M.D.  03/28/2013   *RADIOLOGY REPORT*  Clinical Data:  The patient returns for ultrasound-guided core biopsy of the left breast.   ULTRASOUND GUIDED VACUUM ASSISTED CORE BIOPSY OF THE LEFT BREAST  Comparison: Previous exams.  I met with the patient and we discussed the procedure of ultrasound- guided biopsy, including benefits and alternatives.  We discussed the high likelihood of a successful procedure. We discussed the risks of the procedure including infection, bleeding, tissue injury, clip migration, and inadequate sampling.  Informed written consent was given. The usual time-out protocol was performed immediately prior to the procedure.  Using sterile technique and 2% Lidocaine as local anesthetic, under direct ultrasound visualization, a 12 gauge vacuum-assisted device was used to perform biopsy of lesion in the subareolar region of the left breast using a lateral approach. At the conclusion of the procedure, a ribbon shaped tissue marker clip was deployed into the biopsy cavity.  Follow-up 2-view mammogram was performed and dictated separately.  IMPRESSION: Ultrasound-guided biopsy of left breast mass.  No apparent complications.   Original Report Authenticated By: Nolon Nations, M.D.     LABS:    Chemistry      Component Value Date/Time   NA 142 04/03/2013 1230   NA 142 02/28/2013 1020   K 4.3 04/03/2013 1230   K 4.1 02/28/2013 1020   CL 106 02/28/2013 1020   CO2 29 04/03/2013 1230   CO2 26 02/28/2013 1020   BUN 12.3 04/03/2013 1230   BUN 14 02/28/2013 1020   CREATININE 1.2* 04/03/2013 1230   CREATININE 0.84 02/28/2013 1020      Component Value Date/Time   CALCIUM 9.7 04/03/2013 1230   CALCIUM 10.2 02/28/2013 1020   ALKPHOS 73 04/03/2013 1230   AST 12 04/03/2013 1230   ALT 12 04/03/2013 1230   BILITOT 0.54 04/03/2013 1230      Lab Results  Component Value Date   WBC 7.6 04/03/2013   HGB 12.6 05/02/2013   HCT 34.4* 04/03/2013   MCV 88.2 04/03/2013   PLT 243 04/03/2013   PATHOLOGY: ADDITIONAL INFORMATION: PROGNOSTIC INDICATORS - ACIS Results: IMMUNOHISTOCHEMICAL AND MORPHOMETRIC ANALYSIS BY THE AUTOMATED CELLULAR IMAGING  SYSTEM (ACIS) Estrogen Receptor: 78%, POSITIVE, STRONG STAINING INTENSITY Progesterone Receptor: 80%, POSITIVE, STRONG STAINING INTENSITY REFERENCE RANGE ESTROGEN RECEPTOR NEGATIVE <1% POSITIVE =>1% PROGESTERONE RECEPTOR NEGATIVE <1% POSITIVE =>1% All controls stained appropriately Enid Cutter MD Pathologist, Electronic Signature ( Signed 04/03/2013) FINAL DIAGNOSIS Diagnosis Breast, left, needle core biopsy, mass, subareolar - DUCTAL CARCINOMA IN SITU. - SEE COMMENT. 1 of 2 FINAL for BRITAIN, ANAGNOS ([id number]) Microscopic Comment The carcinoma appears low to intermediate grade. Estrogen receptor and progesterone receptor studies Roberts be performed and the results reported separately. The results were called to the [place] [facility] on 03/29/2013. (JBK:kh 03/29/13) Enid Cutter MD Pathologist, Electronic Signature (Case signed 03/29/2013) SFINAL DIAGNOSIS Diagnosis 1. Breast, lumpectomy, Left - BENIGN BREAST TISSUE, SEE COMMENT. - NEGATIVE FOR ATYPIA OR MALIGNANCY. - SURGICAL MARGIN, NEGATIVE FOR ATYPIA OR MALIGNANCY. 2. Breast, excision, Left additional inferior margins - SCLEROSED INTRADUCTAL PAPILLOMA, SEE COMMENT. - PREVIOUS BIOPSY SITE IDENTIFIED. 3. Breast, excision, Left additional medial margins - BENIGN BREAST TISSUE, SEE COMMENT. - NEGATIVE FOR ATYPIA OR MALIGNANCY. 4. Breast, excision, Left additional superior margins - BENIGN BREAST TISSUE, SEE COMMENT. - NEGATIVE FOR ATYPIA OR MALIGNANCY. 5. Breast, excision, Left lateral margins - BENIGN BREAST, SEE COMMENT. - NEGATIVE FOR ATYPIA OR MALIGNANCY. Microscopic Comment 1. The surgical resection margin(s) of the specimen were inked and microscopically evaluated. Although there was no biopsy clip identified, the 0.7 cm ill defined area of hemorrhage was entirely submitted. The area of hemorrhage as well as other representative sections do not demonstrate any atypical or malignant epithelial or  stromal findings. 2. The surgical resection margin(s) of the specimen were inked and microscopically evaluated. The 0.9 cm indurated white nodule grossly identified was submitted for review. Slide sections demonstrate sclerosed intraductal papilloma with associated fat necrosis. The myoepithelial layer was demonstrated with smooth muscle myosin heavy chain, Calponin and p63 immunostains. There is no aberrant cytokeratin 5/6 expression within the epithelium. The papilloma is immediately adjacent to and involved by previous biopsy site change. The remaining representative sections demonstrate non-neoplastic findings to include fibrocystic change, pseudoangiomatous stromal hyperplasia and usual ductal hyperplasia. (CR:kh 05-07-13) 3. The surgical resection margin(s) of the specimen were inked and microscopically evaluated. 4. The surgical resection margin(s) of the specimen were inked and microscopically evaluated. 1 of 3 FINAL for Erica Roberts, BRAATEN ([id number]) Microscopic Comment(continued) 5. The surgical resection  margin(s) of the specimen were inked and microscopically evaluated. Representative sections demonstrate non-neoplastic findings to include fibrocystic change. There are no atypical or malignant epithelial or stromal findings identified. The surgical marginspecimen Gross and Clinica ASSESSMENT    43 year old female with  #1 new diagnosis of low-grade ductal carcinoma in situ of the left breast the tumor was ER positive PR positive. Patient is seen in the multidisciplinary breast clinic for discussion of treatment options. She is clinically stage 0. Recommendation is lumpectomy with sentinel lymph node biopsy.  #2 patient is now status post left lumpectomy with the final pathology revealing DCIS only. Sentinel nodes were negative.  #3s/pradiation oncology for adjuvant radiation therapy.   #4.begin adjuvant tamoxifen 20 mg daily with curative intent. 5 years of therapy is  planned    PLAN: 1. Proceed with tamoxifen 20 mg daily  2. RTC in 3 months for follow up  Thank you so much for allowing me to participate in the care of [employer]. I Roberts continue to follow up the patient with you and assist in her care.  All questions were answered. The patient knows to call the clinic with any problems, questions or concerns. We can certainly see the patient much sooner if necessary.  I spent 15 minutes counseling the patient face to face. The total time spent in the appointment was 15 minutes.   Marcy Panning, MD Medical/Oncology [facility] [phone] (beeper) [phone] (Office)  08/06/2013, 11:50 AM

## 2013-08-11 ENCOUNTER — Encounter: Payer: Self-pay | Admitting: Oncology

## 2013-08-16 ENCOUNTER — Encounter (INDEPENDENT_AMBULATORY_CARE_PROVIDER_SITE_OTHER): Payer: Self-pay | Admitting: Surgery

## 2013-08-16 ENCOUNTER — Ambulatory Visit (INDEPENDENT_AMBULATORY_CARE_PROVIDER_SITE_OTHER): Payer: BC Managed Care – PPO | Admitting: Surgery

## 2013-08-16 VITALS — BP 140/90 | HR 76 | Temp 97.1°F | Resp 14 | Ht 68.5 in | Wt 236.8 lb

## 2013-08-16 DIAGNOSIS — Z853 Personal history of malignant neoplasm of breast: Secondary | ICD-10-CM

## 2013-08-16 NOTE — Patient Instructions (Signed)
Return 1 year. 

## 2013-08-16 NOTE — Progress Notes (Signed)
NAME: Erica Roberts       DOB: Mar 02, 1971           DATE: 08/16/2013        MRN: [id number]  CC:   Chief Complaint  Patient presents with  . Breast Cancer Long Term Follow Up    LTFU 3 mo lumpy recheck    [name] is a 43 y.o.Marland Kitchenfemale with history of DCIS.      STAGE:  Cancer of central portion of female breast  Primary site: Breast (Left)  Staging method: AJCC 7th Edition  Clinical: Stage 0 (Tis (DCIS), N0, cM0)  Summary: Stage 0 (Tis (DCIS), N0, cM0)   [name] is a 43 y.o. female.  #1 medical history significant for hypertension and anemia.  #2Patient underwent a screening mammogram that revealed suspicious findings. Ultrasound showed a 9 mm lesion her in a biopsy was performed this revealed low grade ductal carcinoma in situ. The receptor studies are revealing the tumor to be ER positive PR positive. MRI is pending. Her case was discussed at the multidisciplinary breast clinic and conference.  #3 s/p RT given between 06/05/13 - 07/24/13  4. Curative intent tamoxifen 20 mg daily x 5 years  CURRENT THERAPY: tamoxifen 20 mg daily begin 07/31/13           She has no problems or concerns on either side.  PFSH: She has had no significant changes since the last visit here.  ROS: There have been no significant changes since the last visit here  EXAM:  VS: BP 140/90  Pulse 76  Temp(Src) 97.1 F (36.2 C) (Temporal)  Resp 14  Ht 5' 8.5" (1.74 m)  Wt 236 lb 12.8 oz (107.412 kg)  BMI 35.48 kg/m2  General: The patient is alert, oriented, generally healthy appearing, NAD. Mood and affect are normal.  Breasts:  LEFT BREAST SHOWS WELL HEALED CIRCUMAREOLAR INCISION.   POST RADIATION CHANGES NOTED. NO MASSES.  SCARRING NOTED. RIGHT BREAST NORMAL  Lymphatics: She has no axillary or supraclavicular adenopathy on either side.  Extremities: Full ROM of the surgical side with no lymphedema noted.  Data Reviewed: DR Chancy Milroy  NOTE  Impression: Doing well, with no evidence of recurrent cancer or new cancer  Plan: Will continue to follow up on an annual basis here.

## 2013-08-29 ENCOUNTER — Ambulatory Visit
Admission: RE | Admit: 2013-08-29 | Discharge: 2013-08-29 | Disposition: A | Discharge status: Home / Self Care | Payer: BC Managed Care – PPO | Source: Ambulatory Visit | Attending: Radiation Oncology | Admitting: Radiation Oncology

## 2013-08-29 ENCOUNTER — Encounter: Payer: Self-pay | Admitting: Radiation Oncology

## 2013-08-29 VITALS — BP 129/85 | HR 61 | Temp 98.7°F | Resp 20 | Wt 235.2 lb

## 2013-08-29 DIAGNOSIS — C50119 Malignant neoplasm of central portion of unspecified female breast: Secondary | ICD-10-CM

## 2013-08-29 NOTE — Progress Notes (Signed)
Radiation Oncology         [phone] ________________________________  Name: Erica Roberts MRN: [id number]  Date: 08/29/2013  DOB: [birth year]  Follow-Up Visit Note  CC: Ron Parker, MD  Deatra Robinson, MD  Diagnosis:   Left-sided breast cancer  Interval Since Last Radiation:  Approximately one month   Narrative:  The patient returns today for routine follow-up.  She has done well overall since she finished treatment. The patient's skin has healed significantly since she completed her course of radiation treatment. She has begun anti-hormonal treatment.                              ALLERGIES:  has No Known Allergies.  Meds: Current Outpatient Prescriptions  Medication Sig Dispense Refill  . hyaluronate sodium (RADIAPLEXRX) GEL Apply 1 application topically once.      Marland Kitchen ibuprofen (ADVIL,MOTRIN) 600 MG tablet 1 po pc every 6 hours for 5 days then prn-pain  30 tablet  1  . non-metallic deodorant (ALRA) MISC Apply 1 application topically daily as needed.      Marland Kitchen olmesartan-hydrochlorothiazide (BENICAR HCT) 40-25 MG per tablet Take 1 tablet by mouth daily.      . ondansetron (ZOFRAN) 4 MG tablet Take 1 tablet (4 mg total) by mouth every 8 (eight) hours as needed for nausea.  20 tablet  0  . tamoxifen (NOLVADEX) 20 MG tablet Take 1 tablet (20 mg total) by mouth daily.  90 tablet  12   No current facility-administered medications for this encounter.    Physical Findings: The patient is in no acute distress. Patient is alert and oriented.  weight is 235 lb 3.2 oz (106.686 kg). Her oral temperature is 98.7 F (37.1 C). Her blood pressure is 129/85 and her pulse is 61. Her respiration is 20. .   The skin in the treatment area has healed satisfactorily, no areas of concern/moist desquamation/poor healing  Lab Findings: Lab Results  Component Value Date   WBC 7.6 04/03/2013   HGB 12.6 05/02/2013   HCT 34.4* 04/03/2013   MCV 88.2 04/03/2013   PLT 243 04/03/2013      Radiographic Findings: No results found.  Impression:    The patient has done satisfactorily since finishing treatment. She has begun anti-hormonal treatment.  Plan:  The patient will followup in our clinic on a when necessary basis.   Jodelle Gross, M.D., Ph.D.

## 2013-08-29 NOTE — Progress Notes (Signed)
Follow up rad tx left breast, hyperpigmentation still, skin intact, occasional pain in breast area, appetite good, energy level fair, no pain 9:55 AM

## 2013-10-21 NOTE — Progress Notes (Signed)
Rcvd drug clarification request on pt.  Tamoxifen/Nuvaring.  Dr. Humphrey Rolls specified Tamoxifen only and signed.  Faxed then sent to scan.

## 2013-11-06 ENCOUNTER — Telehealth: Payer: Self-pay | Admitting: Oncology

## 2013-11-06 ENCOUNTER — Ambulatory Visit (HOSPITAL_BASED_OUTPATIENT_CLINIC_OR_DEPARTMENT_OTHER): Payer: BC Managed Care – PPO | Admitting: Oncology

## 2013-11-06 ENCOUNTER — Encounter: Payer: Self-pay | Admitting: Oncology

## 2013-11-06 ENCOUNTER — Other Ambulatory Visit (HOSPITAL_BASED_OUTPATIENT_CLINIC_OR_DEPARTMENT_OTHER): Payer: BC Managed Care – PPO

## 2013-11-06 VITALS — BP 150/91 | HR 84 | Temp 98.5°F | Resp 18 | Ht 68.0 in | Wt 228.6 lb

## 2013-11-06 DIAGNOSIS — C50112 Malignant neoplasm of central portion of left female breast: Secondary | ICD-10-CM

## 2013-11-06 DIAGNOSIS — Z170 Estrogen receptor positive status [ER+]: Secondary | ICD-10-CM

## 2013-11-06 DIAGNOSIS — D0590 Unspecified type of carcinoma in situ of unspecified breast: Secondary | ICD-10-CM

## 2013-11-06 DIAGNOSIS — E875 Hyperkalemia: Secondary | ICD-10-CM

## 2013-11-06 DIAGNOSIS — C50119 Malignant neoplasm of central portion of unspecified female breast: Secondary | ICD-10-CM

## 2013-11-06 DIAGNOSIS — I10 Essential (primary) hypertension: Secondary | ICD-10-CM

## 2013-11-06 LAB — COMPREHENSIVE METABOLIC PANEL (CC13)
ALT: 10 U/L (ref 0–55)
ANION GAP: 7 meq/L (ref 3–11)
AST: 15 U/L (ref 5–34)
Albumin: 4 g/dL (ref 3.5–5.0)
Alkaline Phosphatase: 45 U/L (ref 40–150)
BUN: 14.8 mg/dL (ref 7.0–26.0)
CO2: 26 meq/L (ref 22–29)
Calcium: 10.4 mg/dL (ref 8.4–10.4)
Chloride: 110 mEq/L — ABNORMAL HIGH (ref 98–109)
Creatinine: 0.9 mg/dL (ref 0.6–1.1)
Glucose: 93 mg/dl (ref 70–140)
Potassium: 5.3 mEq/L — ABNORMAL HIGH (ref 3.5–5.1)
Sodium: 143 mEq/L (ref 136–145)
Total Bilirubin: 0.56 mg/dL (ref 0.20–1.20)
Total Protein: 7.7 g/dL (ref 6.4–8.3)

## 2013-11-06 LAB — CBC WITH DIFFERENTIAL/PLATELET
BASO%: 0.6 % (ref 0.0–2.0)
Basophils Absolute: 0 10*3/uL (ref 0.0–0.1)
EOS%: 2.5 % (ref 0.0–7.0)
Eosinophils Absolute: 0.1 10*3/uL (ref 0.0–0.5)
HCT: 36.7 % (ref 34.8–46.6)
HGB: 11.8 g/dL (ref 11.6–15.9)
LYMPH%: 27.7 % (ref 14.0–49.7)
MCH: 29.9 pg (ref 25.1–34.0)
MCHC: 32.2 g/dL (ref 31.5–36.0)
MCV: 93.1 fL (ref 79.5–101.0)
MONO#: 0.6 10*3/uL (ref 0.1–0.9)
MONO%: 11.9 % (ref 0.0–14.0)
NEUT#: 3 10*3/uL (ref 1.5–6.5)
NEUT%: 57.3 % (ref 38.4–76.8)
PLATELETS: 274 10*3/uL (ref 145–400)
RBC: 3.94 10*6/uL (ref 3.70–5.45)
RDW: 13.5 % (ref 11.2–14.5)
WBC: 5.2 10*3/uL (ref 3.9–10.3)
lymph#: 1.5 10*3/uL (ref 0.9–3.3)

## 2013-11-06 NOTE — Telephone Encounter (Signed)
per [facility] appts/printed copy of sch and gave to pt °

## 2013-11-06 NOTE — Progress Notes (Signed)
[facility] OFFICE PROGRESS NOTE  Patient Care Team: Ron Parker, MD as PCP - General (Family Medicine)  DIAGNOSIS: 43 year old female with new diagnosis of DCIS of the left breast.  STAGE:  Cancer of central portion of female breast  Primary site: Breast (Left)  Staging method: AJCC 7th Edition  Clinical: Stage 0 (Tis (DCIS), N0, cM0)  Summary: Stage 0 (Tis (DCIS), N0, cM0)   SUMMARY OF ONCOLOGIC HISTORY: #1 medical history significant for hypertension and anemia.  #2Patient underwent a screening mammogram that revealed suspicious findings. Ultrasound showed a 9 mm lesion her in a biopsy was performed this revealed low grade ductal carcinoma in situ. The receptor studies are revealing the tumor to be ER positive PR positive. MRI is pending. Her case was discussed at the multidisciplinary breast clinic and conference.  #3 s/p RT given between 06/05/13 - 07/24/13  #4. Curative intent tamoxifen 20 mg daily x 5 years   CURRENT THERAPY: tamoxifen 20 mg daily begin 07/31/13   INTERVAL HISTORY: Erica Roberts 43 y.o. female returns for followup visit today. She has been on tamoxifen since January of this year. She seems to be tolerating it well without any significant problems. She's denying having any hot flashes or night sweats no aches or pains no vaginal bleeding discharge no swelling in her lower extremities. She does have a slightly elevated potassium. She does eat a lot of oranges and orange juice. I have recommended that she discontinue this practice and we will repeat another potassium level on her next week. She was given a list of foods that are high in potassium and recommended that she avoid these. She from a cardiology point of view has no history of significant. She has no fevers chills night sweats headaches shortness of breath chest pains palpitations no peripheral paresthesias no easy bruising or bleeding. Remainder of the 10 point review of systems is  negative and as below.  Past Medical History  Diagnosis Date  . Hypertension   . Heartburn   . Anemia   . Wears glasses   . Breast cancer 03/28/13    Left breast biopsy   Past Surgical History  Procedure Laterality Date  . Mouth surgery    . Cesarean section  1991  . Laparoscopic hysterectomy N/A 03/04/2013    Procedure: HYSTERECTOMY TOTAL LAPAROSCOPIC;  Surgeon: Delice Lesch, MD;  Location: [facility];  Service: Gynecology;  Laterality: N/A;  TLH possible LAVH possiblel TAH, Cystoscopy  . Cystoscopy Bilateral 03/04/2013    Procedure: CYSTOSCOPY;  Surgeon: Delice Lesch, MD;  Location: [facility];  Service: Gynecology;  Laterality: Bilateral;  . Lipoma excision  2005    lt shoulder  . Breast lumpectomy with needle localization Left 05/02/2013    Procedure: BREAST LUMPECTOMY WITH NEEDLE LOCALIZATION;  Surgeon: Marcello Moores A. Cornett, MD;  Location: [facility];  Service: General;  Laterality: Left;  NL BCG 11  . Abdominal hysterectomy  03/04/13   History   Social History  . Marital Status: Divorced    Spouse Name: N/A    Number of Children: 1  . Years of Education: N/A   Occupational History  .     Social History Main Topics  . Smoking status: Never Smoker   . Smokeless tobacco: Not on file  . Alcohol Use: Yes     Comment: occasionally  . Drug Use: No  . Sexual Activity: Not Currently   Other Topics Concern  . Not on file  Social History Narrative  . No narrative on file     ALLERGIES:  has No Known Allergies.  MEDICATIONS:  Current Outpatient Prescriptions  Medication Sig Dispense Refill  . olmesartan-hydrochlorothiazide (BENICAR HCT) 40-25 MG per tablet Take 1 tablet by mouth daily.      . tamoxifen (NOLVADEX) 20 MG tablet       . hyaluronate sodium (RADIAPLEXRX) GEL Apply 1 application topically once.      Marland Kitchen ibuprofen (ADVIL,MOTRIN) 600 MG tablet 1 po pc every 6 hours for 5 days then prn-pain  30 tablet  1  . non-metallic deodorant (ALRA) MISC Apply 1  application topically daily as needed.      . ondansetron (ZOFRAN) 4 MG tablet Take 1 tablet (4 mg total) by mouth every 8 (eight) hours as needed for nausea.  20 tablet  0   No current facility-administered medications for this visit.    REVIEW OF SYSTEMS:   Constitutional: Denies fevers, chills or abnormal weight loss Eyes: Denies blurriness of vision Ears, nose, mouth, throat, and face: Denies mucositis or sore throat Respiratory: Denies cough, dyspnea or wheezes Cardiovascular: Denies palpitation, chest discomfort or lower extremity swelling Gastrointestinal:  Denies nausea, heartburn or change in bowel habits Skin: Denies abnormal skin rashes Lymphatics: Denies new lymphadenopathy or easy bruising Neurological:Denies numbness, tingling or new weaknesses Behavioral/Psych: Mood is stable, no new changes  All other systems were reviewed with the patient and are negative.  PHYSICAL EXAMINATION: ECOG PERFORMANCE STATUS: 0 - Asymptomatic  Filed Vitals:   11/06/13 0913  BP: 150/91  Pulse: 84  Temp: 98.5 F (36.9 C)  Resp: 18   Filed Weights   11/06/13 0913  Weight: 228 lb 9.6 oz (103.692 kg)    GENERAL:alert, no distress and comfortable SKIN: skin color, texture, turgor are normal, no rashes or significant lesions EYES: normal, Conjunctiva are pink and non-injected, sclera clear OROPHARYNX:no exudate, no erythema and lips, buccal mucosa, and tongue normal  NECK: supple, thyroid normal size, non-tender, without nodularity LYMPH:  no palpable lymphadenopathy in the cervical, axillary or inguinal LUNGS: clear to auscultation and percussion with normal breathing effort HEART: regular rate & rhythm and no murmurs and no lower extremity edema ABDOMEN:abdomen soft, non-tender and normal bowel sounds Musculoskeletal:no cyanosis of digits and no clubbing  NEURO: alert & oriented x 3 with fluent speech, no focal motor/sensory deficits Breasts: right breast normal without mass, skin  or nipple changes or axillary nodes, left breast normal without mass, skin or nipple changes or axillary nodes, with well healed surgical scar. And darkening of the skin due to RT.  LABORATORY DATA:  I have reviewed the data as listed    Component Value Date/Time   NA 143 11/06/2013 0857   NA 142 02/28/2013 1020   K 5.3 No visable hemolysis* 11/06/2013 0857   K 4.1 02/28/2013 1020   CL 106 02/28/2013 1020   CO2 26 11/06/2013 0857   CO2 26 02/28/2013 1020   GLUCOSE 93 11/06/2013 0857   GLUCOSE 84 02/28/2013 1020   BUN 14.8 11/06/2013 0857   BUN 14 02/28/2013 1020   CREATININE 0.9 11/06/2013 0857   CREATININE 0.84 02/28/2013 1020   CALCIUM 10.4 11/06/2013 0857   CALCIUM 10.2 02/28/2013 1020   PROT 7.7 11/06/2013 0857   ALBUMIN 4.0 11/06/2013 0857   AST 15 11/06/2013 0857   ALT 10 11/06/2013 0857   ALKPHOS 45 11/06/2013 0857   BILITOT 0.56 11/06/2013 0857   GFRNONAA 84* 02/28/2013 1020   GFRAA >  90 02/28/2013 1020    No results found for this basename: SPEP, UPEP,  kappa and lambda light chains    Lab Results  Component Value Date   WBC 5.2 11/06/2013   NEUTROABS 3.0 11/06/2013   HGB 11.8 11/06/2013   HCT 36.7 11/06/2013   MCV 93.1 11/06/2013   PLT 274 11/06/2013      Chemistry      Component Value Date/Time   NA 143 11/06/2013 0857   NA 142 02/28/2013 1020   K 5.3 No visable hemolysis* 11/06/2013 0857   K 4.1 02/28/2013 1020   CL 106 02/28/2013 1020   CO2 26 11/06/2013 0857   CO2 26 02/28/2013 1020   BUN 14.8 11/06/2013 0857   BUN 14 02/28/2013 1020   CREATININE 0.9 11/06/2013 0857   CREATININE 0.84 02/28/2013 1020      Component Value Date/Time   CALCIUM 10.4 11/06/2013 0857   CALCIUM 10.2 02/28/2013 1020   ALKPHOS 45 11/06/2013 0857   AST 15 11/06/2013 0857   ALT 10 11/06/2013 0857   BILITOT 0.56 11/06/2013 0857       RADIOGRAPHIC STUDIES: I have personally reviewed the radiological images as listed and agreed with the findings in the report. No results found.    ASSESSMENT & PLAN:  43 year old female  with  #1 stage 0 (Tis NX) low-grade ductal carcinoma in situ of the left breast status post lumpectomy with the final pathology revealing DCIS only sentinel node was negative. Tumor was ER positive. Postoperatively patient did receive radiation therapy adjuvantly completed on 07/24/2013. She was subsequently begun on adjuvant tamoxifen 20 mg daily starting 08/02/2013. Thus far she is tolerating it well no adverse side effects are noted. She will continue this for a total of 5 years.  #2 hyperkalemia: Unclear etiology however I do think it is due to excessive intake of potassium-containing foods such as oranges. She also takes a multivitamin with potassium minutes. Clinically she is asymptomatic. I have recommended that she stop taking oranges and discontinue her vitamins. I will plan on rechecking her potassium in one week. Of note patient is also on Benicar.  #3 surveillance: Patient will be due for a mammogram in August 2014. I have recommended she call and set up an appointment for this.  #4 followup: Patient will be seen back in 6-8 months time at which time she will also have a CBC/CMET  No orders of the defined types were placed in this encounter.   All questions were answered. The patient knows to call the clinic with any problems, questions or concerns. No barriers to learning was detected. I spent 20 minutes counseling the patient face to face. The total time spent in the appointment was 30 minutes and more than 50% was on counseling and review of test results     Deatra Robinson, MD 11/06/2013 9:46 AM

## 2013-11-06 NOTE — Patient Instructions (Signed)
Potassium Content of Foods Potassium is a mineral found in many foods and drinks. It helps keep fluids and minerals balanced in your body and also affects how steadily your heart beats. The body needs potassium to control blood pressure and to keep the muscles and nervous system healthy. However, certain health conditions and medicine may require you to eat more or less potassium-rich foods and drinks. Your caregiver or dietitian will tell you how much potassium you should have each day. COMMON SERVING SIZES The list below tells you how big or small common portion sizes are:  1 oz.........4 stacked dice.  3 oz.........Deck of cards.  1 tsp........Tip of little finger.  1 tbsp......Thumb.  2 tbsp......Golf ball.   c...........Half of a fist.  1 c............A fist. FOODS AND DRINKS HIGH IN POTASSIUM More than 200 mg of potassium per serving. A serving size is  c (120 mL or noted gram weight) unless otherwise stated. While all the items on this list are high in potassium, some items are higher in potassium than others. Fruits  Apricots (sliced), 83 g.  Apricots (dried halves), 3 oz / 24 g.  Avocado (cubed),  c / 50 g.  Banana (sliced), 75 g.  Cantaloupe (cubed), 80 g.  Dates (pitted), 5 whole / 35 g.  Figs (dried), 4 whole / 32 g.  Guava, c / 55 g.  Honeydew, 1 wedge / 85 g.  Kiwi (sliced), 90 g.  Nectarine, 1 small / 129 g.  Orange, 1 medium / 131 g.  Orange juice.  Pomegranate seeds, 87 g.  Pomegranate juice.  Prunes (pitted), 3 whole / 30 g.  Prune juice, 3 oz / 90 mL.  Seedless raisins, 3 tbsp / 27 g. Vegetables  Artichoke,  of a medium / 64 g.  Asparagus (boiled), 90 g.  Baked beans,  c / 63 g.  Bamboo shoots,  c / 38 g.  Beets (cooked slices), 85 g.  Broccoli (boiled), 78 g.  Brussels sprout (boiled), 78 g.  Butternut squash (baked), 103 g.  Chickpea (cooked), 82 g.  Green peas (cooked), 80 g.  Hubbard squash (baked cubes),  c /  68 g.  Kidney beans (cooked), 5 tbsp / 55 g.  Lima beans (cooked),  c / 43 g.  Navy beans (cooked),  c / 61 g.  Potato (baked), 61 g.  Potato (boiled), 78 g.  Pumpkin (boiled), 123 g.  Refried beans,  c / 79 g.  Spinach (cooked),  c / 45 g.  Split peas (cooked),  c / 65 g.  Sun-dried tomatoes, 2 tbsp / 7 g.  Sweet potato (baked),  c / 50 g.  Tomato (chopped or sliced), 90 g.  Tomato juice.  Tomato paste, 4 tsp / 21 g.  Tomato sauce,  c / 61 g.  Vegetable juice.  White mushrooms (cooked), 78 g.  Yam (cooked or baked),  c / 34 g.  Zucchini squash (boiled), 90 g. Other Foods and Drinks  Almonds (whole),  c / 36 g.  Cashews (oil roasted),  c / 32 g.  Chocolate milk.  Chocolate pudding, 142 g.  Clams (steamed), 1.5 oz / 43 g.  Dark chocolate, 1.5 oz / 42 g.  Fish, 3 oz / 85 g.  King crab (steamed), 3 oz / 85 g.  Lobster (steamed), 4 oz / 113 g.  Milk (skim, 1%, 2%, whole), 1 c / 240 mL.  Milk chocolate, 2.3 oz / 66 g.  Milk shake.  Nonfat fruit   variety yogurt, 123 g.  Peanuts (oil roasted), 1 oz / 28 g.  Peanut butter, 2 tbsp / 32 g.  Pistachio nuts, 1 oz / 28 g.  Pumpkin seeds, 1 oz / 28 g.  Red meat (broiled, cooked, grilled), 3 oz / 85 g.  Scallops (steamed), 3 oz / 85 g.  Shredded wheat cereal (dry), 3 oblong biscuits / 75 g.  Spaghetti sauce,  c / 66 g.  Sunflower seeds (dry roasted), 1 oz / 28 g.  Veggie burger, 1 patty / 70 g. FOODS MODERATE IN POTASSIUM Between 150 mg and 200 mg per serving. A serving is  c (120 mL or noted gram weight) unless otherwise stated. Fruits  Grapefruit,  of the fruit / 123 g.  Grapefruit juice.  Pineapple juice.  Plums (sliced), 83 g.  Tangerine, 1 large / 120 g. Vegetables  Carrots (boiled), 78 g.  Carrots (sliced), 61 g.  Rhubarb (cooked with sugar), 120 g.  Rutabaga (cooked), 120 g.  Sweet corn (cooked), 75 g.  Yellow snap beans (cooked), 63 g. Other Foods and  Drinks   Bagel, 1 bagel / 98 g.  Chicken breast (roasted and chopped),  c / 70 g.  Chocolate ice cream / 66 g.  Pita bread, 1 large / 64 g.  Shrimp (steamed), 4 oz / 113 g.  Swiss cheese (diced), 70 g.  Vanilla ice cream, 66 g.  Vanilla pudding, 140 g. FOODS LOW IN POTASSIUM Less than 150 mg per serving. A serving size is  cup (120 mL or noted gram weight) unless otherwise stated. If you eat more than 1 serving of a food low in potassium, the food may be considered a food high in potassium. Fruits  Apple (slices), 55 g.  Apple juice.  Applesauce, 122 g.  Blackberries, 72 g.  Blueberries, 74 g.  Cranberries, 50 g.  Cranberry juice.  Fruit cocktail, 119 g.  Fruit punch.  Grapes, 46 g.  Grape juice.  Mandarin oranges (canned), 126 g.  Peach (slices), 77 g.  Pineapple (chunks), 83 g.  Raspberries, 62 g.  Red cherries (without pits), 78 g.  Strawberries (sliced), 83 g.  Watermelon (diced), 76 g. Vegetables  Alfalfa sprouts, 17 g.  Bell peppers (sliced), 46 g.  Cabbage (shredded), 35 g.  Cauliflower (boiled), 62 g.  Celery, 51 g.  Collard greens (boiled), 95 g.  Cucumber (sliced), 52 g.  Eggplant (cubed), 41 g.  Green beans (boiled), 63 g.  Lettuce (shredded), 1 c / 36 g.  Onions (sauteed), 44 g.  Radishes (sliced), 58 g.  Spaghetti squash, 51 g. Other Foods and Drinks  Angel food cake, 1 slice / 28 g.  Black tea.  Brown rice (cooked), 98 g.  Butter croissant, 1 medium / 57 g.  Carbonated soda.  Coffee.  Cheddar cheese (diced), 66 g.  Corn flake cereal (dry), 14 g.  Cottage cheese, 118 g.  Cream of rice cereal (cooked), 122 g.  Cream of wheat cereal (cooked), 126 g.  Crisped rice cereal (dry), 14 g.  Egg (boiled, fried, poached, omelet, scrambled), 1 large / 46 61 g.  English muffin, 1 muffin / 57 g.  Frozen ice pop, 1 pop / 55 g.  Graham cracker, 1 large rectangular cracker / 14 g.  Jelly beans, 112  g.  Non-dairy whipped topping.  Oatmeal, 88 g.  Orange sherbet, 74 g.  Puffed rice cereal (dry), 7 g.  Pasta (cooked), 70 g.  Rice cakes, 4 cakes / 36   g.  Sugared doughnut, 4 oz / 116 g.  White bread, 1 slice / 30 g.  White rice (cooked), 79 93 g.  Wild rice (cooked), 82 g.  Yellow cake, 1 slice / 68 g. Document Released: 02/22/2005 Document Revised: 06/27/2012 Document Reviewed: 11/25/2011 [employer] Patient Information 2014 [employer].  

## 2013-11-12 ENCOUNTER — Other Ambulatory Visit (HOSPITAL_BASED_OUTPATIENT_CLINIC_OR_DEPARTMENT_OTHER): Payer: BC Managed Care – PPO

## 2013-11-12 DIAGNOSIS — C50119 Malignant neoplasm of central portion of unspecified female breast: Secondary | ICD-10-CM

## 2013-11-12 DIAGNOSIS — D0590 Unspecified type of carcinoma in situ of unspecified breast: Secondary | ICD-10-CM

## 2013-11-12 LAB — BASIC METABOLIC PANEL (CC13)
Anion Gap: 8 mEq/L (ref 3–11)
BUN: 18.6 mg/dL (ref 7.0–26.0)
CO2: 24 meq/L (ref 22–29)
Calcium: 9.8 mg/dL (ref 8.4–10.4)
Chloride: 109 mEq/L (ref 98–109)
Creatinine: 1 mg/dL (ref 0.6–1.1)
Glucose: 96 mg/dl (ref 70–140)
POTASSIUM: 4.6 meq/L (ref 3.5–5.1)
SODIUM: 140 meq/L (ref 136–145)

## 2013-11-13 ENCOUNTER — Telehealth: Payer: Self-pay | Admitting: *Deleted

## 2013-11-13 NOTE — Telephone Encounter (Signed)
Message copied by Hebert Soho on Wed Nov 13, 2013  2:35 PM ------      Message from: Minette Headland      Created: Wed Nov 13, 2013  1:23 AM       Please call patient.  Her potassium on 11/06/13 was 5.3.  She needs a repeat lab check sometime this week.  Please put in pof and order for bmet.  Thanks, LC       ----- Message -----         From: [facility]         Sent: 11/06/2013   9:05 AM           To: Deatra Robinson, MD                   ------

## 2013-11-13 NOTE — Telephone Encounter (Signed)
This RN spoke with patient regarding potassium level. Potassium level on 11/12/13 was 4.6 No need to recheck level. Patient verbalized understanding.

## 2014-04-19 ENCOUNTER — Telehealth: Payer: Self-pay | Admitting: Hematology and Oncology

## 2014-04-19 NOTE — Telephone Encounter (Signed)
Lft pt msg confirming labs/ov MD change, mailed out sch..... KJ

## 2014-07-07 ENCOUNTER — Other Ambulatory Visit: Payer: BC Managed Care – PPO

## 2014-07-07 ENCOUNTER — Ambulatory Visit: Payer: BC Managed Care – PPO | Admitting: Oncology

## 2014-07-07 ENCOUNTER — Other Ambulatory Visit: Payer: Self-pay | Admitting: *Deleted

## 2014-07-07 DIAGNOSIS — C50119 Malignant neoplasm of central portion of unspecified female breast: Secondary | ICD-10-CM

## 2014-07-08 ENCOUNTER — Other Ambulatory Visit: Payer: Self-pay | Admitting: Hematology and Oncology

## 2014-07-08 ENCOUNTER — Other Ambulatory Visit (HOSPITAL_BASED_OUTPATIENT_CLINIC_OR_DEPARTMENT_OTHER): Payer: BC Managed Care – PPO

## 2014-07-08 ENCOUNTER — Telehealth: Payer: Self-pay | Admitting: Hematology and Oncology

## 2014-07-08 ENCOUNTER — Ambulatory Visit (HOSPITAL_BASED_OUTPATIENT_CLINIC_OR_DEPARTMENT_OTHER): Payer: BC Managed Care – PPO | Admitting: Hematology and Oncology

## 2014-07-08 VITALS — BP 140/89 | HR 83 | Temp 98.2°F | Resp 18 | Ht 68.0 in | Wt 237.7 lb

## 2014-07-08 DIAGNOSIS — D0512 Intraductal carcinoma in situ of left breast: Secondary | ICD-10-CM

## 2014-07-08 DIAGNOSIS — C50119 Malignant neoplasm of central portion of unspecified female breast: Secondary | ICD-10-CM

## 2014-07-08 DIAGNOSIS — N951 Menopausal and female climacteric states: Secondary | ICD-10-CM

## 2014-07-08 DIAGNOSIS — Z170 Estrogen receptor positive status [ER+]: Secondary | ICD-10-CM

## 2014-07-08 DIAGNOSIS — Z853 Personal history of malignant neoplasm of breast: Secondary | ICD-10-CM

## 2014-07-08 DIAGNOSIS — C50112 Malignant neoplasm of central portion of left female breast: Secondary | ICD-10-CM

## 2014-07-08 DIAGNOSIS — M6289 Other specified disorders of muscle: Secondary | ICD-10-CM

## 2014-07-08 LAB — CBC WITH DIFFERENTIAL/PLATELET
BASO%: 0.9 % (ref 0.0–2.0)
BASOS ABS: 0.1 10*3/uL (ref 0.0–0.1)
EOS%: 2.3 % (ref 0.0–7.0)
Eosinophils Absolute: 0.1 10*3/uL (ref 0.0–0.5)
HEMATOCRIT: 36 % (ref 34.8–46.6)
HEMOGLOBIN: 11.6 g/dL (ref 11.6–15.9)
LYMPH%: 21.6 % (ref 14.0–49.7)
MCH: 29.8 pg (ref 25.1–34.0)
MCHC: 32.2 g/dL (ref 31.5–36.0)
MCV: 92.6 fL (ref 79.5–101.0)
MONO#: 0.6 10*3/uL (ref 0.1–0.9)
MONO%: 11.3 % (ref 0.0–14.0)
NEUT%: 63.9 % (ref 38.4–76.8)
NEUTROS ABS: 3.5 10*3/uL (ref 1.5–6.5)
PLATELETS: 235 10*3/uL (ref 145–400)
RBC: 3.89 10*6/uL (ref 3.70–5.45)
RDW: 13.5 % (ref 11.2–14.5)
WBC: 5.5 10*3/uL (ref 3.9–10.3)
lymph#: 1.2 10*3/uL (ref 0.9–3.3)

## 2014-07-08 LAB — COMPREHENSIVE METABOLIC PANEL (CC13)
ALBUMIN: 3.7 g/dL (ref 3.5–5.0)
ALK PHOS: 47 U/L (ref 40–150)
ALT: 16 U/L (ref 0–55)
AST: 14 U/L (ref 5–34)
Anion Gap: 10 mEq/L (ref 3–11)
BUN: 11.9 mg/dL (ref 7.0–26.0)
CO2: 26 mEq/L (ref 22–29)
Calcium: 9.4 mg/dL (ref 8.4–10.4)
Chloride: 104 mEq/L (ref 98–109)
Creatinine: 0.9 mg/dL (ref 0.6–1.1)
GLUCOSE: 96 mg/dL (ref 70–140)
Potassium: 3.5 mEq/L (ref 3.5–5.1)
Sodium: 140 mEq/L (ref 136–145)
Total Bilirubin: 0.58 mg/dL (ref 0.20–1.20)
Total Protein: 6.9 g/dL (ref 6.4–8.3)

## 2014-07-08 NOTE — Assessment & Plan Note (Addendum)
DCIS low-grade left breast status post lumpectomy  margin negative, ER positive status post adjuvant radiation completed 07/24/2013 currently on adjuvant tamoxifen that started 08/02/2013  Tamoxifen toxicities: 1. Hot flashes 2. Muscle stiffness  Surveillance:  1. Breast exam 07/08/2014 normal 2. Mammogram scheduled for 07/23/2014  Survivorship:Discussed the importance of physical exercise in decreasing the likelihood of breast cancer recurrence. Recommended 30 mins daily 6 days a week of either brisk walking or cycling or swimming. Encouraged patient to eat more fruits and vegetables and decrease red meat. Patient is overweight and has made plans to join the gym and lose 25 pounds in the next 6 months. I discussed with her about healthy weight and the importance of exercise in improving her general health and well-being. I would not be doing blood work since she'll be seeing her primary care physician.  Return to clinic in 6 months for follow-up

## 2014-07-08 NOTE — Telephone Encounter (Signed)
, °

## 2014-07-08 NOTE — Progress Notes (Signed)
Patient Care Team: Ron Parker, MD as PCP - General (Family Medicine)  DIAGNOSIS: Cancer of central portion of left female breast   Staging form: Breast, AJCC 7th Edition     Clinical: Stage 0 (Tis (DCIS), N0, cM0) - Unsigned       Staging comments: Staged at breast conference 04/03/13.      Pathologic: No stage assigned - Unsigned  STAGE:  Cancer of central portion of female breast  Primary site: Breast (Left)  Staging method: AJCC 7th Edition  Clinical: Stage 0 (Tis (DCIS), N0, cM0)  Summary: Stage 0 (Tis (DCIS), N0, cM0)   SUMMARY OF ONCOLOGIC HISTORY: #1 medical history significant for hypertension and anemia.  #2Patient underwent a screening mammogram that revealed suspicious findings. Ultrasound showed a 9 mm lesion her in a biopsy was performed this revealed low grade ductal carcinoma in situ. The receptor studies are revealing the tumor to be ER positive PR positive. MRI is pending. Her case was discussed at the multidisciplinary breast clinic and conference.  #3 s/p RT given between 06/05/13 - 07/24/13  #4. Curative intent tamoxifen 20 mg daily x 5 years   CURRENT THERAPY: tamoxifen 20 mg daily begin 07/31/13  CHIEF COMPLIANT: Follow-up of DCIS  INTERVAL HISTORY: Erica Roberts is a 43 -year-old [place] American female with above-mentioned history of DCIS involving the left breast that was treated with lumpectomy and radiation is currently on tamoxifen and tolerating it very well. 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 Denies any major problems taking tamoxifen.  REVIEW OF SYSTEMS:   Constitutional: Denies fevers, chills or abnormal weight loss Eyes: Denies blurriness of vision Ears, nose, mouth, throat, and face: Denies mucositis or sore throat Respiratory: Denies cough, dyspnea or wheezes Cardiovascular: Denies palpitation, chest discomfort or lower extremity swelling Gastrointestinal:  Denies  nausea, heartburn or change in bowel habits Skin: Denies abnormal skin rashes Lymphatics: Denies new lymphadenopathy or easy bruising Neurological:Denies numbness, tingling or new weaknesses Behavioral/Psych: Mood is stable, no new changes  Breast:  denies any pain or lumps or nodules in either breasts All other systems were reviewed with the patient and are negative.  I have reviewed the past medical history, past surgical history, social history and family history with the patient and they are unchanged from previous note.  ALLERGIES:  has No Known Allergies.  MEDICATIONS:  Current Outpatient Prescriptions  Medication Sig Dispense Refill  . hyaluronate sodium (RADIAPLEXRX) GEL Apply 1 application topically once.    Marland Kitchen olmesartan-hydrochlorothiazide (BENICAR HCT) 40-25 MG per tablet Take 1 tablet by mouth daily.    . tamoxifen (NOLVADEX) 20 MG tablet     . ibuprofen (ADVIL,MOTRIN) 600 MG tablet 1 po pc every 6 hours for 5 days then prn-pain (Patient not taking: Reported on 07/08/2014) 30 tablet 1  . non-metallic deodorant (ALRA) MISC Apply 1 application topically daily as needed.    . ondansetron (ZOFRAN) 4 MG tablet Take 1 tablet (4 mg total) by mouth every 8 (eight) hours as needed for nausea. (Patient not taking: Reported on 07/08/2014) 20 tablet 0   No current facility-administered medications for this visit.    PHYSICAL EXAMINATION: ECOG PERFORMANCE STATUS: 0 - Asymptomatic  Filed Vitals:   07/08/14 0907  BP: 140/89  Pulse: 83  Temp: 98.2 F (36.8 C)  Resp: 18   Filed Weights   07/08/14 0907  Weight: 237 lb 11.2 oz (107.82 kg)    GENERAL:alert, no distress  and comfortable SKIN: skin color, texture, turgor are normal, no rashes or significant lesions EYES: normal, Conjunctiva are pink and non-injected, sclera clear OROPHARYNX:no exudate, no erythema and lips, buccal mucosa, and tongue normal  NECK: supple, thyroid normal size, non-tender, without nodularity LYMPH:   no palpable lymphadenopathy in the cervical, axillary or inguinal LUNGS: clear to auscultation and percussion with normal breathing effort HEART: regular rate & rhythm and no murmurs and no lower extremity edema ABDOMEN:abdomen soft, non-tender and normal bowel sounds Musculoskeletal:no cyanosis of digits and no clubbing  NEURO: alert & oriented x 3 with fluent speech, no focal motor/sensory deficits BREAST: No palpable masses or nodules in either right or left breasts. No palpable axillary supraclavicular or infraclavicular adenopathy no breast tenderness or nipple discharge.   LABORATORY DATA:  I have reviewed the data as listed   Chemistry      Component Value Date/Time   NA 140 07/08/2014 0834   NA 142 02/28/2013 1020   K 3.5 07/08/2014 0834   K 4.1 02/28/2013 1020   CL 106 02/28/2013 1020   CO2 26 07/08/2014 0834   CO2 26 02/28/2013 1020   BUN 11.9 07/08/2014 0834   BUN 14 02/28/2013 1020   CREATININE 0.9 07/08/2014 0834   CREATININE 0.84 02/28/2013 1020      Component Value Date/Time   CALCIUM 9.4 07/08/2014 0834   CALCIUM 10.2 02/28/2013 1020   ALKPHOS 47 07/08/2014 0834   AST 14 07/08/2014 0834   ALT 16 07/08/2014 0834   BILITOT 0.58 07/08/2014 0834       Lab Results  Component Value Date   WBC 5.5 07/08/2014   HGB 11.6 07/08/2014   HCT 36.0 07/08/2014   MCV 92.6 07/08/2014   PLT 235 07/08/2014   NEUTROABS 3.5 07/08/2014    ASSESSMENT & PLAN:  Cancer of central portion of left female breast DCIS low-grade left breast status post lumpectomy  margin negative, ER positive status post adjuvant radiation completed 07/24/2013 currently on adjuvant tamoxifen that started 08/02/2013  Tamoxifen toxicities: 1. Hot flashes 2. Muscle stiffness  Surveillance:  1. Breast exam 07/08/2014 normal 2. Mammogram scheduled for 07/23/2014  Survivorship:Discussed the importance of physical exercise in decreasing the likelihood of breast cancer recurrence. Recommended 30  mins daily 6 days a week of either brisk walking or cycling or swimming. Encouraged patient to eat more fruits and vegetables and decrease red meat. Patient is overweight and has made plans to join the gym and lose 25 pounds in the next 6 months. I discussed with her about healthy weight and the importance of exercise in improving her general health and well-being. I would not be doing blood work since she'll be seeing her primary care physician.  Return to clinic in 6 months for follow-up     No orders of the defined types were placed in this encounter.   The patient has a good understanding of the overall plan. she agrees with it. She will call with any problems that may develop before her next visit here.   Rulon Eisenmenger, MD 07/08/2014 9:52 AM

## 2014-07-23 ENCOUNTER — Ambulatory Visit
Admission: RE | Admit: 2014-07-23 | Discharge: 2014-07-23 | Disposition: A | Discharge status: Home / Self Care | Payer: BC Managed Care – PPO | Source: Ambulatory Visit | Attending: Hematology and Oncology | Admitting: Hematology and Oncology

## 2014-07-23 DIAGNOSIS — Z853 Personal history of malignant neoplasm of breast: Secondary | ICD-10-CM

## 2014-08-25 ENCOUNTER — Telehealth: Payer: Self-pay | Admitting: Hematology and Oncology

## 2014-08-25 NOTE — Telephone Encounter (Signed)
due to PAL moved 6/16 appt to 6/30. s/w pt she is aware.

## 2014-09-25 ENCOUNTER — Other Ambulatory Visit: Payer: Self-pay | Admitting: Oncology

## 2015-01-08 ENCOUNTER — Ambulatory Visit: Payer: BC Managed Care – PPO | Admitting: Hematology and Oncology

## 2015-01-21 NOTE — Assessment & Plan Note (Addendum)
DCIS low-grade left breast status post lumpectomy margin negative, ER positive status post adjuvant radiation completed 07/24/2013 currently on adjuvant tamoxifen that started 08/02/2013  Tamoxifen toxicities: 1. Hot flashes 2. Muscle stiffness  Surveillance:  1. Breast exam 07/08/2014 normal 2. Mammogram scheduled for 07/23/2014  RTC 6 months

## 2015-01-22 ENCOUNTER — Ambulatory Visit: Payer: Self-pay | Admitting: Hematology and Oncology

## 2015-07-04 ENCOUNTER — Other Ambulatory Visit: Payer: Self-pay | Admitting: Hematology and Oncology

## 2015-07-06 ENCOUNTER — Other Ambulatory Visit: Payer: Self-pay | Admitting: *Deleted

## 2015-07-06 ENCOUNTER — Telehealth: Payer: Self-pay | Admitting: Hematology and Oncology

## 2015-07-06 NOTE — Telephone Encounter (Signed)
Spoke with patient and she is aware of her new appointment and a calendar has been mailed to her as well

## 2015-08-03 ENCOUNTER — Ambulatory Visit: Payer: Self-pay | Admitting: Hematology and Oncology

## 2015-08-03 ENCOUNTER — Telehealth: Payer: Self-pay | Admitting: Hematology and Oncology

## 2015-08-03 NOTE — Telephone Encounter (Signed)
Patient called in to reschedule due to weather °

## 2015-08-03 NOTE — Assessment & Plan Note (Signed)
DCIS low-grade left breast status post lumpectomy margin negative, ER positive status post adjuvant radiation completed 07/24/2013 currently on adjuvant tamoxifen that started 08/02/2013  Tamoxifen toxicities: 1. Hot flashes 2. Muscle stiffness  Surveillance:  1. Breast exam  08/03/2015 normal 2. Mammogram 07/23/2014  Benign , breast density category B. Patient will need annual mammogram

## 2015-08-05 NOTE — Assessment & Plan Note (Signed)
DCIS low-grade left breast status post lumpectomy margin negative, ER positive status post adjuvant radiation completed 07/24/2013 currently on adjuvant tamoxifen that started 08/02/2013  Tamoxifen toxicities: 1. Hot flashes 2. Muscle stiffness  Surveillance:  1. Breast exam 08/06/2015 normal 2. Mammogram 07/23/2014 normal  Survivorship:Discussed the importance of physical exercise in decreasing the likelihood of breast cancer recurrence. Recommended 30 mins daily 6 days a week   Return to clinic in 1 year for follow-up

## 2015-08-06 ENCOUNTER — Telehealth: Payer: Self-pay | Admitting: Hematology and Oncology

## 2015-08-06 ENCOUNTER — Ambulatory Visit (HOSPITAL_BASED_OUTPATIENT_CLINIC_OR_DEPARTMENT_OTHER): Payer: BLUE CROSS/BLUE SHIELD | Admitting: Hematology and Oncology

## 2015-08-06 ENCOUNTER — Encounter: Payer: Self-pay | Admitting: Hematology and Oncology

## 2015-08-06 VITALS — BP 127/75 | HR 88 | Temp 98.2°F | Resp 20 | Wt 236.8 lb

## 2015-08-06 DIAGNOSIS — E663 Overweight: Secondary | ICD-10-CM

## 2015-08-06 DIAGNOSIS — C50112 Malignant neoplasm of central portion of left female breast: Secondary | ICD-10-CM

## 2015-08-06 DIAGNOSIS — D0512 Intraductal carcinoma in situ of left breast: Secondary | ICD-10-CM

## 2015-08-06 DIAGNOSIS — E669 Obesity, unspecified: Secondary | ICD-10-CM

## 2015-08-06 DIAGNOSIS — N951 Menopausal and female climacteric states: Secondary | ICD-10-CM

## 2015-08-06 DIAGNOSIS — Z170 Estrogen receptor positive status [ER+]: Secondary | ICD-10-CM | POA: Diagnosis not present

## 2015-08-06 DIAGNOSIS — M6289 Other specified disorders of muscle: Secondary | ICD-10-CM | POA: Diagnosis not present

## 2015-08-06 MED ORDER — NALTREXONE-BUPROPION HCL ER 8-90 MG PO TB12: 1.0000 | ORAL_TABLET | ORAL | Status: DC

## 2015-08-06 NOTE — Telephone Encounter (Signed)
Spoke with patient and she is aware of her mammo 1/17

## 2015-08-06 NOTE — Progress Notes (Signed)
Patient Care Team: Larina Earthly, MD as PCP - General (Family Medicine)  DIAGNOSIS: Cancer of central portion of left female breast [facility])   Staging form: Breast, AJCC 7th Edition     Clinical: Stage 0 (Tis (DCIS), N0, cM0) - Unsigned       Staging comments: Staged at breast conference 04/03/13.      Pathologic: No stage assigned - Unsigned  SUMMARY OF ONCOLOGIC HISTORY:   Cancer of central portion of left female breast ([place])   03/28/2013 Initial Diagnosis  Left breast biopsy: subgaleal: DCIS, ER 78%, PR 80% , low to intermediate grade   05/02/2013 Surgery  Left lumpectomy: no residual DCIS benign breast tissue   06/05/2013 - 07/24/2013 Radiation Therapy  Adjuvant radiation therapy   07/31/2013 -  Anti-estrogen oral therapy  Tamoxifen 20 mg daily 5 years   CHIEF COMPLIANT: concern about weight issues  INTERVAL HISTORY: Erica Roberts is a 45 year old with above-mentioned history of left breast cancer currently on tamoxifen therapy. She is extremely worried about her weight. She has not been able to lose any weight. She denies any major trouble from tamoxifen than mild hot flashes. She denies any lumps or nodules in the breasts.  REVIEW OF SYSTEMS:   Constitutional: Denies fevers, chills or abnormal weight loss Eyes: Denies blurriness of vision Ears, nose, mouth, throat, and face: Denies mucositis or sore throat Respiratory: Denies cough, dyspnea or wheezes Cardiovascular: Denies palpitation, chest discomfort Gastrointestinal:  Denies nausea, heartburn or change in bowel habits Skin: Denies abnormal skin rashes Lymphatics: Denies new lymphadenopathy or easy bruising Neurological:Denies numbness, tingling or new weaknesses Behavioral/Psych: Mood is stable, no new changes  Extremities: No lower extremity edema Breast:  denies any pain or lumps or nodules in either breasts All other systems were reviewed with the patient and are negative.  I have reviewed the past medical  history, past surgical history, social history and family history with the patient and they are unchanged from previous note.  ALLERGIES:  has No Known Allergies.  MEDICATIONS:  Current Outpatient Prescriptions  Medication Sig Dispense Refill  . hyaluronate sodium (RADIAPLEXRX) GEL Apply 1 application topically once.    Marland Kitchen ibuprofen (ADVIL,MOTRIN) 600 MG tablet 1 po pc every 6 hours for 5 days then prn-pain (Patient not taking: Reported on 07/08/2014) 30 tablet 1  . Naltrexone-Bupropion HCl ER (CONTRAVE) 8-90 MG TB12 Take 1 tablet by mouth See admin instructions. One tablet once daily in the morning for 1 week; at week 2, increase to 1 tablet twice daily administered in the morning and evening and continue for 1 week; at week 3, increase to 2 tablets in the morning and 1 tablet in the evening and continue for 1 week; at week 4, increase to 2 tablets twice daily administered in the morning and evening 120 tablet 2  . non-metallic deodorant (ALRA) MISC Apply 1 application topically daily as needed.    Marland Kitchen olmesartan-hydrochlorothiazide (BENICAR HCT) 40-25 MG per tablet Take 1 tablet by mouth daily.    . ondansetron (ZOFRAN) 4 MG tablet Take 1 tablet (4 mg total) by mouth every 8 (eight) hours as needed for nausea. (Patient not taking: Reported on 07/08/2014) 20 tablet 0  . tamoxifen (NOLVADEX) 20 MG tablet     . tamoxifen (NOLVADEX) 20 MG tablet TAKE 1 TABLET DAILY 90 tablet 0   No current facility-administered medications for this visit.    PHYSICAL EXAMINATION: ECOG PERFORMANCE STATUS: 0 - Asymptomatic  Filed Vitals:   08/06/15 1146  BP: 127/75  Pulse: 88  Temp: 98.2 F (36.8 C)  Resp: 20   Filed Weights   08/06/15 1146  Weight: 236 lb 12.8 oz (107.412 kg)    GENERAL:alert, no distress and comfortable SKIN: skin color, texture, turgor are normal, no rashes or significant lesions EYES: normal, Conjunctiva are pink and non-injected, sclera clear OROPHARYNX:no exudate, no erythema and  lips, buccal mucosa, and tongue normal  NECK: supple, thyroid normal size, non-tender, without nodularity LYMPH:  no palpable lymphadenopathy in the cervical, axillary or inguinal LUNGS: clear to auscultation and percussion with normal breathing effort HEART: regular rate & rhythm and no murmurs and no lower extremity edema ABDOMEN:abdomen soft, non-tender and normal bowel sounds MUSCULOSKELETAL:no cyanosis of digits and no clubbing  NEURO: alert & oriented x 3 with fluent speech, no focal motor/sensory deficits EXTREMITIES: No lower extremity edema  LABORATORY DATA:  I have reviewed the data as listed   Chemistry      Component Value Date/Time   NA 140 07/08/2014 0834   NA 142 02/28/2013 1020   K 3.5 07/08/2014 0834   K 4.1 02/28/2013 1020   CL 106 02/28/2013 1020   CO2 26 07/08/2014 0834   CO2 26 02/28/2013 1020   BUN 11.9 07/08/2014 0834   BUN 14 02/28/2013 1020   CREATININE 0.9 07/08/2014 0834   CREATININE 0.84 02/28/2013 1020      Component Value Date/Time   CALCIUM 9.4 07/08/2014 0834   CALCIUM 10.2 02/28/2013 1020   ALKPHOS 47 07/08/2014 0834   AST 14 07/08/2014 0834   ALT 16 07/08/2014 0834   BILITOT 0.58 07/08/2014 0834       Lab Results  Component Value Date   WBC 5.5 07/08/2014   HGB 11.6 07/08/2014   HCT 36.0 07/08/2014   MCV 92.6 07/08/2014   PLT 235 07/08/2014   NEUTROABS 3.5 07/08/2014     ASSESSMENT & PLAN:  Cancer of central portion of left female breast DCIS low-grade left breast status post lumpectomy margin negative, ER positive status post adjuvant radiation completed 07/24/2013 currently on adjuvant tamoxifen that started 08/02/2013  Tamoxifen toxicities: 1. Hot flashes 2. Muscle stiffness  Surveillance:  1. Breast exam 08/06/2015 normal 2. Mammogram 07/23/2014 normal. Patient will need annual mammogram and we will order this to be done in the next 1-2 weeks..  Obesity (BMI 35): Patient is extremely concerned about her weight  issues. She has not been exercising. She wishes that I prescribed her a weight loss medication. I discussed that weight loss medications can carry significant risks. After much discussion, we elected to prescribe her Contrave which is a combination of Wellbutrin with naltrexone. I discussed with her that Wellbutrin decreases the efficacy of tamoxifen. She is fully aware of the risks. Our goal is to treat her only for a very short interval (3 months). Patient will be going regularly to [employer] and working out. She has always been watchful of her diet.  I discussed with her that antidepressants can increase suicidal ideation as well. I provided her instructions on how to take the medication. She will start with 1 tablet a day for a week then we will increase to 1 tablet twice a day for next week and then will increase to 2 tablets in the morning and 1 tablet in the evening and finally she will take 2 tablets in the morning and 2 tablets in the evening. I instructed her that the third month she would also taper down her dose just  as we increase the dosage in the first month.  Survivorship:Discussed the importance of physical exercise in decreasing the likelihood of breast cancer recurrence. Recommended 30 mins daily 6 days a week   Return to clinic in 6 months for follow-up  No orders of the defined types were placed in this encounter.   The patient has a good understanding of the overall plan. she agrees with it. she will call with any problems that may develop before the next visit here.   Rulon Eisenmenger, MD 08/06/2015   he

## 2015-08-06 NOTE — Telephone Encounter (Signed)
Appointments made and avs printed for patient °

## 2015-08-06 NOTE — Addendum Note (Signed)
Addended by: Prentiss Bells on: 08/06/2015 02:12 PM   Modules accepted: Medications

## 2015-08-11 ENCOUNTER — Ambulatory Visit
Admission: RE | Admit: 2015-08-11 | Discharge: 2015-08-11 | Disposition: A | Discharge status: Home / Self Care | Payer: No Typology Code available for payment source | Source: Ambulatory Visit | Attending: Hematology and Oncology | Admitting: Hematology and Oncology

## 2015-08-11 DIAGNOSIS — C50112 Malignant neoplasm of central portion of left female breast: Secondary | ICD-10-CM

## 2015-10-02 ENCOUNTER — Other Ambulatory Visit: Payer: Self-pay | Admitting: Hematology and Oncology

## 2015-12-29 ENCOUNTER — Other Ambulatory Visit: Payer: Self-pay | Admitting: Hematology and Oncology

## 2016-02-03 NOTE — Assessment & Plan Note (Signed)
DCIS low-grade left breast status post lumpectomy margin negative, ER positive status post adjuvant radiation completed 07/24/2013 currently on adjuvant tamoxifen that started 08/02/2013  Tamoxifen toxicities: 1. Hot flashes 2. Muscle stiffness  Surveillance:  1. Breast exam 08/06/2015 normal 2. Mammogram 07/23/2014 normal. Patient will need annual mammogram and we will order this to be done in the next 1-2 weeks..  Obesity (BMI 35): Tried Contrave for weight loss  RTC in 1 year

## 2016-02-04 ENCOUNTER — Ambulatory Visit: Payer: BLUE CROSS/BLUE SHIELD | Admitting: Hematology and Oncology

## 2016-09-26 ENCOUNTER — Other Ambulatory Visit: Payer: Self-pay | Admitting: Hematology and Oncology

## 2016-09-26 DIAGNOSIS — Z853 Personal history of malignant neoplasm of breast: Secondary | ICD-10-CM

## 2016-09-28 DIAGNOSIS — N750 Cyst of Bartholin's gland: Secondary | ICD-10-CM | POA: Diagnosis not present

## 2016-09-28 DIAGNOSIS — Z6836 Body mass index (BMI) 36.0-36.9, adult: Secondary | ICD-10-CM | POA: Diagnosis not present

## 2016-10-11 ENCOUNTER — Ambulatory Visit
Admission: RE | Admit: 2016-10-11 | Discharge: 2016-10-11 | Disposition: A | Discharge status: Home / Self Care | Payer: BLUE CROSS/BLUE SHIELD | Source: Ambulatory Visit | Attending: Hematology and Oncology | Admitting: Hematology and Oncology

## 2016-10-11 ENCOUNTER — Ambulatory Visit (INDEPENDENT_AMBULATORY_CARE_PROVIDER_SITE_OTHER): Payer: BLUE CROSS/BLUE SHIELD | Admitting: Sports Medicine

## 2016-10-11 ENCOUNTER — Ambulatory Visit (INDEPENDENT_AMBULATORY_CARE_PROVIDER_SITE_OTHER): Payer: BLUE CROSS/BLUE SHIELD

## 2016-10-11 DIAGNOSIS — M79675 Pain in left toe(s): Secondary | ICD-10-CM

## 2016-10-11 DIAGNOSIS — M21619 Bunion of unspecified foot: Secondary | ICD-10-CM | POA: Diagnosis not present

## 2016-10-11 DIAGNOSIS — Z853 Personal history of malignant neoplasm of breast: Secondary | ICD-10-CM

## 2016-10-11 DIAGNOSIS — R928 Other abnormal and inconclusive findings on diagnostic imaging of breast: Secondary | ICD-10-CM | POA: Diagnosis not present

## 2016-10-11 NOTE — Progress Notes (Signed)
Subjective: Erica Roberts is a 46 y.o. female patient who presents to office for evaluation of Left bunion pain. Patient complains of progressive pain especially over the last few months at Left foot at bone that's sticking out that starts as pain over the bump with direct pressure and range of motion; patient now has difficulty fitting shoes comfortably especially boots.  Patient has also tried change in shoes with no complete relief. Patient denies any other pedal complaints.   Patient Active Problem List   Diagnosis Date Noted  . Obesity (BMI 30-39.9) 08/06/2015  . History of breast cancer 08/16/2013  . Post-operative state 05/13/2013  . Cancer of central portion of left female breast ([place]) 04/01/2013    Current Outpatient Prescriptions on File Prior to Visit  Medication Sig Dispense Refill  . hyaluronate sodium (RADIAPLEXRX) GEL Apply 1 application topically once.    Marland Kitchen ibuprofen (ADVIL,MOTRIN) 600 MG tablet 1 po pc every 6 hours for 5 days then prn-pain 30 tablet 1  . Naltrexone-Bupropion HCl ER (CONTRAVE) 8-90 MG TB12 Take 1 tablet by mouth See admin instructions. One tablet once daily in the morning for 1 week; at week 2, increase to 1 tablet twice daily administered in the morning and evening and continue for 1 week; at week 3, increase to 2 tablets in the morning and 1 tablet in the evening and continue for 1 week; at week 4, increase to 2 tablets twice daily administered in the morning and evening 120 tablet 2  . non-metallic deodorant (ALRA) MISC Apply 1 application topically daily as needed.    Marland Kitchen olmesartan-hydrochlorothiazide (BENICAR HCT) 40-25 MG per tablet Take 1 tablet by mouth daily.    . ondansetron (ZOFRAN) 4 MG tablet Take 1 tablet (4 mg total) by mouth every 8 (eight) hours as needed for nausea. 20 tablet 0  . tamoxifen (NOLVADEX) 20 MG tablet     . tamoxifen (NOLVADEX) 20 MG tablet TAKE 1 TABLET DAILY 90 tablet 0  . tamoxifen (NOLVADEX) 20 MG tablet TAKE 1 TABLET  DAILY 90 tablet 0  . tamoxifen (NOLVADEX) 20 MG tablet TAKE 1 TABLET DAILY 90 tablet 3   No current facility-administered medications on file prior to visit.     No Known Allergies  Objective:  General: Alert and oriented x3 in no acute distress  Dermatology: No open lesions bilateral lower extremities, no webspace macerations, no ecchymosis bilateral, all nails x 10 are well manicured.  Vascular: Dorsalis Pedis and Posterior Tibial pedal pulses 2/4, Capillary Fill Time 3 seconds, (+) pedal hair growth bilateral, no edema bilateral lower extremities, Temperature gradient within normal limits.  Neurology: Gross sensation intact via light touch bilateral. (-) Tinels sign bilateral.   Musculoskeletal: Mild tenderness with palpation left bunion deformity, no limitation or crepitus with range of motion, deformity reducible, tracking not trackbound, there is no 1st ray hypermobility noted bilateral. Midtarsal, Subtalar joint, and ankle joint range of motion is within normal limits. On weightbearing exam, there is decreased 1st MTPJ rom Left with functional limitus noted, there is medial arch collapse Left>Right on weightbearing, rearfoot slight valgus, forefoot slight abduction with HAV deformity supported on ground with no second toe crossover deformity noted.   Gait: Non-Antalgic gait with increased medial arch collapse and pronatory influence noted bilateral foot with medial 1st MTPJ roll-off at toe-off, heel off within normal limits.   Xrays  Left Foot    Impression: Intermetatarsal angle above normal limits mildly increased grade 2 bunion with mild elevatus and pes  planus deformity. No other acute findings.       Assessment and Plan: Problem List Items Addressed This Visit    None    Visit Diagnoses    Pain of left great toe    -  Primary   Relevant Orders   DG Foot Complete Left (Completed)   Bunion           -Complete examination performed -Xrays reviewed -Discussed treatement  options; discussed HAV deformity;conservative and  Surgical management; risks, benefits, alternatives discussed. All patient's questions answered. -Patient would like to hold off on surgery at this time until after business trips -Dispensed silicone bunion shield to use on left -Recommend continue with good supportive shoes and inserts.  -Patient to return to office as needed or when ready for surgery consult or sooner if condition worsens.  Landis Martins, DPM

## 2016-10-11 NOTE — Patient Instructions (Signed)
Bunionectomy, Care After Refer to this sheet in the next few weeks. These instructions provide you with information about caring for yourself after your procedure. Your health care provider may also give you more specific instructions. Your treatment has been planned according to current medical practices, but problems sometimes occur. Call your health care provider if you have any problems or questions after your procedure. What can I expect after the procedure? After your procedure, it is typical to have the following:  Pain.  Swelling. Follow these instructions at home:  Take medicines only as directed by your health care provider.  Apply ice to the affected area:  Put ice in a plastic bag.  Place a towel between your skin and the bag.  Leave the ice on for 20 minutes, 2-3 times a day.  There are many different ways to close and cover an incision, including stitches (sutures), skin glue, and adhesive strips. Follow your health care provider's instructions about:  Incision care.  Bandage (dressing) changes and removal.  Incision closure removal.  Keep your foot raised (elevated) while you are resting.  You may need to wear a brace or a boot that keeps your foot in the proper position. Wear the boot or brace as directed by your health care provider.  Use crutches, canes, or walkers as directed by your health care provider.  Do not put weight on your foot until your health care provider approves.  Rest as needed. Follow your health care provider's instructions on when you can return to your usual activities, like walking and driving.  Do not wear high heels or tight-fitting shoes.  Do physical therapy exercises to strengthen your foot as directed by your health care provider. Contact a health care provider if:  You have increased bleeding from the incision.  You have drainage, redness, swelling, or pain at your incision site.  You have a fever or chills.  You notice a  bad smell coming from the incision or the dressing.  Your dressing gets wet or falls off.  Your calf begins to swell.  Your toes feel numb or stiff. Get help right away if:  You have a rash.  You have difficulty breathing. This information is not intended to replace advice given to you by your health care provider. Make sure you discuss any questions you have with your health care provider. Document Released: 01/28/2005 Document Revised: 12/17/2015 Document Reviewed: 02/26/2014 Elsevier Interactive Patient Education  2017 [employer].

## 2016-12-23 ENCOUNTER — Other Ambulatory Visit: Payer: Self-pay | Admitting: Hematology and Oncology

## 2016-12-23 NOTE — Telephone Encounter (Signed)
Pt needs to see MD for follow up prior to future refills.

## 2017-03-17 ENCOUNTER — Other Ambulatory Visit: Payer: Self-pay | Admitting: Hematology and Oncology

## 2017-03-22 ENCOUNTER — Telehealth: Payer: Self-pay | Admitting: Hematology and Oncology

## 2017-03-22 NOTE — Telephone Encounter (Signed)
sw pt to confirm 8/31 appt at 1115 am per sch msg

## 2017-03-24 ENCOUNTER — Ambulatory Visit (HOSPITAL_BASED_OUTPATIENT_CLINIC_OR_DEPARTMENT_OTHER): Payer: BLUE CROSS/BLUE SHIELD | Admitting: Hematology and Oncology

## 2017-03-24 ENCOUNTER — Encounter: Payer: Self-pay | Admitting: Hematology and Oncology

## 2017-03-24 DIAGNOSIS — Z923 Personal history of irradiation: Secondary | ICD-10-CM

## 2017-03-24 DIAGNOSIS — D0512 Intraductal carcinoma in situ of left breast: Secondary | ICD-10-CM

## 2017-03-24 DIAGNOSIS — Z79811 Long term (current) use of aromatase inhibitors: Secondary | ICD-10-CM | POA: Diagnosis not present

## 2017-03-24 DIAGNOSIS — C50112 Malignant neoplasm of central portion of left female breast: Secondary | ICD-10-CM

## 2017-03-24 DIAGNOSIS — Z170 Estrogen receptor positive status [ER+]: Principal | ICD-10-CM

## 2017-03-24 DIAGNOSIS — N951 Menopausal and female climacteric states: Secondary | ICD-10-CM

## 2017-03-24 MED ORDER — TAMOXIFEN CITRATE 20 MG PO TABS: 20.0000 mg | ORAL_TABLET | Freq: Every day | ORAL | 3 refills | Status: DC

## 2017-03-24 NOTE — Assessment & Plan Note (Signed)
DCIS low-grade left breast status post lumpectomy margin negative, ER positive status post adjuvant radiation completed 07/24/2013 currently on adjuvant tamoxifen that started 08/02/2013  Tamoxifen toxicities: 1. Hot flashes 2. Muscle stiffness  Surveillance:  1. Breast exam 03/24/2017 normal 2. Mammogram 10/11/2016 normal.   Obesity (BMI 35):  Return to clinic in 1 year for follow-up

## 2017-03-24 NOTE — Progress Notes (Signed)
Patient Care Team: Larina Earthly, MD as PCP - General (Family Medicine)  DIAGNOSIS:  Encounter Diagnosis  Name Primary?  . Malignant neoplasm of central portion of left breast in female, estrogen receptor positive ([place])     SUMMARY OF ONCOLOGIC HISTORY:   Cancer of central portion of left female breast ([place])   03/28/2013 Initial Diagnosis     Left breast biopsy: subgaleal: DCIS, ER 78%, PR 80% , low to intermediate grade      05/02/2013 Surgery     Left lumpectomy: no residual DCIS benign breast tissue      06/05/2013 - 07/24/2013 Radiation Therapy     Adjuvant radiation therapy      07/31/2013 -  Anti-estrogen oral therapy     Tamoxifen 20 mg daily 5 years       CHIEF COMPLIANT: Follow-up on tamoxifen therapy  INTERVAL HISTORY: Erica Roberts is a 46 year old with above-mentioned history left breast DCIS treated with lumpectomy followed by radiation and tamoxifen she is tolerating tamoxifen fairly well. Her biggest complaint is her weight gain issues. She is better to join an exercise program. She denies any lumps or nodules in the breasts.  REVIEW OF SYSTEMS:   Constitutional: Denies fevers, chills or abnormal weight loss Eyes: Denies blurriness of vision Ears, nose, mouth, throat, and face: Denies mucositis or sore throat Respiratory: Denies cough, dyspnea or wheezes Cardiovascular: Denies palpitation, chest discomfort Gastrointestinal:  Denies nausea, heartburn or change in bowel habits Skin: Denies abnormal skin rashes Lymphatics: Denies new lymphadenopathy or easy bruising Neurological:Denies numbness, tingling or new weaknesses Behavioral/Psych: Mood is stable, no new changes  Extremities: No lower extremity edema Breast:  denies any pain or lumps or nodules in either breasts All other systems were reviewed with the patient and are negative.  I have reviewed the past medical history, past surgical history, social history and family history with the  patient and they are unchanged from previous note.  ALLERGIES:  has No Known Allergies.  MEDICATIONS:  Current Outpatient Prescriptions  Medication Sig Dispense Refill  . hyaluronate sodium (RADIAPLEXRX) GEL Apply 1 application topically once.    Marland Kitchen ibuprofen (ADVIL,MOTRIN) 600 MG tablet 1 po pc every 6 hours for 5 days then prn-pain 30 tablet 1  . Naltrexone-Bupropion HCl ER (CONTRAVE) 8-90 MG TB12 Take 1 tablet by mouth See admin instructions. One tablet once daily in the morning for 1 week; at week 2, increase to 1 tablet twice daily administered in the morning and evening and continue for 1 week; at week 3, increase to 2 tablets in the morning and 1 tablet in the evening and continue for 1 week; at week 4, increase to 2 tablets twice daily administered in the morning and evening 120 tablet 2  . non-metallic deodorant (ALRA) MISC Apply 1 application topically daily as needed.    Marland Kitchen olmesartan-hydrochlorothiazide (BENICAR HCT) 40-25 MG per tablet Take 1 tablet by mouth daily.    . ondansetron (ZOFRAN) 4 MG tablet Take 1 tablet (4 mg total) by mouth every 8 (eight) hours as needed for nausea. 20 tablet 0  . tamoxifen (NOLVADEX) 20 MG tablet     . tamoxifen (NOLVADEX) 20 MG tablet TAKE 1 TABLET DAILY 90 tablet 0  . tamoxifen (NOLVADEX) 20 MG tablet TAKE 1 TABLET DAILY 90 tablet 0  . tamoxifen (NOLVADEX) 20 MG tablet TAKE 1 TABLET DAILY 90 tablet 3  . tamoxifen (NOLVADEX) 20 MG tablet TAKE 1 TABLET DAILY 30 tablet 0   No current  facility-administered medications for this visit.     PHYSICAL EXAMINATION: ECOG PERFORMANCE STATUS: 0 - Asymptomatic  Vitals:   03/24/17 1124  BP: (!) 164/82  Pulse: 90  Resp: 20  Temp: 99.3 F (37.4 C)  SpO2: 100%   Filed Weights   03/24/17 1124  Weight: 240 lb 6.4 oz (109 kg)    GENERAL:alert, no distress and comfortable SKIN: skin color, texture, turgor are normal, no rashes or significant lesions EYES: normal, Conjunctiva are pink and non-injected,  sclera clear OROPHARYNX:no exudate, no erythema and lips, buccal mucosa, and tongue normal  NECK: supple, thyroid normal size, non-tender, without nodularity LYMPH:  no palpable lymphadenopathy in the cervical, axillary or inguinal LUNGS: clear to auscultation and percussion with normal breathing effort HEART: regular rate & rhythm and no murmurs and no lower extremity edema ABDOMEN:abdomen soft, non-tender and normal bowel sounds MUSCULOSKELETAL:no cyanosis of digits and no clubbing  NEURO: alert & oriented x 3 with fluent speech, no focal motor/sensory deficits EXTREMITIES: No lower extremity edema  LABORATORY DATA:  I have reviewed the data as listed   Chemistry      Component Value Date/Time   NA 140 07/08/2014 0834   K 3.5 07/08/2014 0834   CL 106 02/28/2013 1020   CO2 26 07/08/2014 0834   BUN 11.9 07/08/2014 0834   CREATININE 0.9 07/08/2014 0834      Component Value Date/Time   CALCIUM 9.4 07/08/2014 0834   ALKPHOS 47 07/08/2014 0834   AST 14 07/08/2014 0834   ALT 16 07/08/2014 0834   BILITOT 0.58 07/08/2014 0834       Lab Results  Component Value Date   WBC 5.5 07/08/2014   HGB 11.6 07/08/2014   HCT 36.0 07/08/2014   MCV 92.6 07/08/2014   PLT 235 07/08/2014   NEUTROABS 3.5 07/08/2014    ASSESSMENT & PLAN:  Cancer of central portion of left female breast DCIS low-grade left breast status post lumpectomy margin negative, ER positive status post adjuvant radiation completed 07/24/2013 currently on adjuvant tamoxifen that started 08/02/2013  Tamoxifen toxicities: 1. Hot flashes 2. Muscle stiffness She will finish 5 years of therapy by December 2019 Surveillance:  1. Breast exam 03/24/2017 normal 2. Mammogram 10/11/2016 normal.   Obesity (BMI 35): Patient is starting to exercise program.  Return to clinic in 1 year for follow-up   I spent 25 minutes talking to the patient of which more than half was spent in counseling and coordination of care.  No  orders of the defined types were placed in this encounter.  The patient has a good understanding of the overall plan. she agrees with it. she will call with any problems that may develop before the next visit here.   Rulon Eisenmenger, MD 03/24/17

## 2017-11-07 ENCOUNTER — Other Ambulatory Visit: Payer: Self-pay | Admitting: Hematology and Oncology

## 2017-11-07 DIAGNOSIS — Z853 Personal history of malignant neoplasm of breast: Secondary | ICD-10-CM

## 2017-11-13 ENCOUNTER — Ambulatory Visit
Admission: RE | Admit: 2017-11-13 | Discharge: 2017-11-13 | Disposition: A | Discharge status: Home / Self Care | Payer: BLUE CROSS/BLUE SHIELD | Source: Ambulatory Visit | Attending: Hematology and Oncology | Admitting: Hematology and Oncology

## 2017-11-13 DIAGNOSIS — Z853 Personal history of malignant neoplasm of breast: Secondary | ICD-10-CM

## 2017-11-13 DIAGNOSIS — R928 Other abnormal and inconclusive findings on diagnostic imaging of breast: Secondary | ICD-10-CM | POA: Diagnosis not present

## 2017-11-13 HISTORY — DX: Personal history of irradiation: Z92.3

## 2018-01-01 ENCOUNTER — Telehealth: Payer: Self-pay | Admitting: Hematology and Oncology

## 2018-01-01 ENCOUNTER — Inpatient Hospital Stay: Payer: BLUE CROSS/BLUE SHIELD | Attending: Hematology and Oncology | Admitting: Hematology and Oncology

## 2018-01-01 ENCOUNTER — Other Ambulatory Visit: Payer: Self-pay | Admitting: *Deleted

## 2018-01-01 DIAGNOSIS — D0512 Intraductal carcinoma in situ of left breast: Secondary | ICD-10-CM | POA: Insufficient documentation

## 2018-01-01 DIAGNOSIS — E669 Obesity, unspecified: Secondary | ICD-10-CM | POA: Diagnosis not present

## 2018-01-01 DIAGNOSIS — Z923 Personal history of irradiation: Secondary | ICD-10-CM | POA: Diagnosis not present

## 2018-01-01 DIAGNOSIS — Z6835 Body mass index (BMI) 35.0-35.9, adult: Secondary | ICD-10-CM | POA: Insufficient documentation

## 2018-01-01 DIAGNOSIS — Z170 Estrogen receptor positive status [ER+]: Secondary | ICD-10-CM | POA: Insufficient documentation

## 2018-01-01 DIAGNOSIS — N951 Menopausal and female climacteric states: Secondary | ICD-10-CM | POA: Diagnosis not present

## 2018-01-01 DIAGNOSIS — Z79810 Long term (current) use of selective estrogen receptor modulators (SERMs): Secondary | ICD-10-CM

## 2018-01-01 DIAGNOSIS — C50112 Malignant neoplasm of central portion of left female breast: Secondary | ICD-10-CM

## 2018-01-01 MED ORDER — TAMOXIFEN CITRATE 20 MG PO TABS: 20.0000 mg | ORAL_TABLET | Freq: Every day | ORAL | 0 refills | Status: DC

## 2018-01-01 NOTE — Progress Notes (Signed)
Patient Care Team: Larina Earthly, MD as PCP - General (Family Medicine)  DIAGNOSIS:  Encounter Diagnosis  Name Primary?  . Malignant neoplasm of central portion of left breast in female, estrogen receptor positive ([place])     SUMMARY OF ONCOLOGIC HISTORY:   Cancer of central portion of left female breast ([place])   03/28/2013 Initial Diagnosis     Left breast biopsy: subgaleal: DCIS, ER 78%, PR 80% , low to intermediate grade      05/02/2013 Surgery     Left lumpectomy: no residual DCIS benign breast tissue      06/05/2013 - 07/24/2013 Radiation Therapy     Adjuvant radiation therapy      07/31/2013 -  Anti-estrogen oral therapy     Tamoxifen 20 mg daily 5 years       CHIEF COMPLIANT: Follow-up on tamoxifen therapy for DCIS  INTERVAL HISTORY: Erica Roberts is a 47 year old with above-mentioned history of left breast DCIS treated with lumpectomy and radiation is currently on tamoxifen therapy.  She will complete 5 years of therapy by December 2019.  She is tolerated it reasonably well.  She does have mild hot flashes and muscle stiffness but she is able to tolerate it reasonably well.  She has not been exercising and gained a few pounds.  REVIEW OF SYSTEMS:   Constitutional: Denies fevers, chills or abnormal weight loss Eyes: Denies blurriness of vision Ears, nose, mouth, throat, and face: Denies mucositis or sore throat Respiratory: Denies cough, dyspnea or wheezes Cardiovascular: Denies palpitation, chest discomfort Gastrointestinal:  Denies nausea, heartburn or change in bowel habits Skin: Denies abnormal skin rashes Lymphatics: Denies new lymphadenopathy or easy bruising Neurological:Denies numbness, tingling or new weaknesses Behavioral/Psych: Mood is stable, no new changes  Extremities: No lower extremity edema Breast:  denies any pain or lumps or nodules in either breasts All other systems were reviewed with the patient and are negative.  I have reviewed the  past medical history, past surgical history, social history and family history with the patient and they are unchanged from previous note.  ALLERGIES:  has No Known Allergies.  MEDICATIONS:  Current Outpatient Medications  Medication Sig Dispense Refill  . tamoxifen (NOLVADEX) 20 MG tablet Take 1 tablet (20 mg total) by mouth daily. 90 tablet 3   No current facility-administered medications for this visit.     PHYSICAL EXAMINATION: ECOG PERFORMANCE STATUS: 1 - Symptomatic but completely ambulatory  Vitals:   01/01/18 0902  BP: (!) 141/91  Pulse: 88  Resp: 17  Temp: 99 F (37.2 C)  SpO2: 100%   Filed Weights   01/01/18 0902  Weight: 243 lb 9.6 oz (110.5 kg)    GENERAL:alert, no distress and comfortable SKIN: skin color, texture, turgor are normal, no rashes or significant lesions EYES: normal, Conjunctiva are pink and non-injected, sclera clear OROPHARYNX:no exudate, no erythema and lips, buccal mucosa, and tongue normal  NECK: supple, thyroid normal size, non-tender, without nodularity LYMPH:  no palpable lymphadenopathy in the cervical, axillary or inguinal LUNGS: clear to auscultation and percussion with normal breathing effort HEART: regular rate & rhythm and no murmurs and no lower extremity edema ABDOMEN:abdomen soft, non-tender and normal bowel sounds MUSCULOSKELETAL:no cyanosis of digits and no clubbing  NEURO: alert & oriented x 3 with fluent speech, no focal motor/sensory deficits EXTREMITIES: No lower extremity edema BREAST: No palpable masses or nodules in either right or left breasts. No palpable axillary supraclavicular or infraclavicular adenopathy no breast tenderness or nipple discharge. (exam  performed in the presence of a chaperone)  LABORATORY DATA:  I have reviewed the data as listed CMP Latest Ref Rng & Units 07/08/2014 11/12/2013 11/06/2013  Glucose 70 - 140 mg/dl 96 96 93  BUN 7.0 - 26.0 mg/dL 11.9 18.6 14.8  Creatinine 0.6 - 1.1 mg/dL 0.9 1.0 0.9    Sodium 136 - 145 mEq/L 140 140 143  Potassium 3.5 - 5.1 mEq/L 3.5 4.6 5.3 No visable hemolysis(H)  Chloride 96 - 112 mEq/L - - -  CO2 22 - 29 mEq/L 26 24 26   Calcium 8.4 - 10.4 mg/dL 9.4 9.8 10.4  Total Protein 6.4 - 8.3 g/dL 6.9 - 7.7  Total Bilirubin 0.20 - 1.20 mg/dL 0.58 - 0.56  Alkaline Phos 40 - 150 U/L 47 - 45  AST 5 - 34 U/L 14 - 15  ALT 0 - 55 U/L 16 - 10    Lab Results  Component Value Date   WBC 5.5 07/08/2014   HGB 11.6 07/08/2014   HCT 36.0 07/08/2014   MCV 92.6 07/08/2014   PLT 235 07/08/2014   NEUTROABS 3.5 07/08/2014    ASSESSMENT & PLAN:  Cancer of central portion of left female breast DCIS low-grade left breast status post lumpectomy margin negative, ER positive status post adjuvant radiation completed 07/24/2013 currently on adjuvant tamoxifen that started 08/02/2013  Tamoxifen toxicities: 1. Hot flashes 2. Muscle stiffness She will finish 5 years of therapy by December 2019.  This will conclude her antiestrogen therapy.  Surveillance:  1. Breast exam  01/01/2018  no palpable lumps or nodules of concern 2. Mammogram  11/13/2017 benign.  Breast density category B  Obesity (BMI 35):  I discussed with her once again about restarting on exercise and weight loss program.  Provided her regarding a weight loss clinic.  Return to clinic in 1 year for follow-up with me she wanted to see me instead of the survivorship clinic.Marland Kitchen   No orders of the defined types were placed in this encounter.  The patient has a good understanding of the overall plan. she agrees with it. she will call with any problems that may develop before the next visit here.   Harriette Ohara, MD 01/01/18

## 2018-01-01 NOTE — Assessment & Plan Note (Signed)
DCIS low-grade left breast status post lumpectomy margin negative, ER positive status post adjuvant radiation completed 07/24/2013 currently on adjuvant tamoxifen that started 08/02/2013  Tamoxifen toxicities: 1. Hot flashes 2. Muscle stiffness She will finish 5 years of therapy by December 2019.  This will conclude her antiestrogen therapy.  Surveillance:  1. Breast exam  01/01/2018  no palpable lumps or nodules of concern 2. Mammogram  11/13/2017 benign.  Breast density category B  Obesity (BMI 35): Patient is starting to exercise program.  Return to clinic in 1 year for follow-up with survivorship clinic

## 2018-01-01 NOTE — Telephone Encounter (Signed)
Gave avs and calendar ° °

## 2018-03-22 ENCOUNTER — Ambulatory Visit: Payer: BLUE CROSS/BLUE SHIELD | Admitting: Hematology and Oncology

## 2018-03-27 ENCOUNTER — Encounter (INDEPENDENT_AMBULATORY_CARE_PROVIDER_SITE_OTHER): Payer: BLUE CROSS/BLUE SHIELD

## 2018-04-04 ENCOUNTER — Ambulatory Visit (INDEPENDENT_AMBULATORY_CARE_PROVIDER_SITE_OTHER): Payer: BLUE CROSS/BLUE SHIELD | Admitting: Bariatrics

## 2018-04-04 ENCOUNTER — Encounter (INDEPENDENT_AMBULATORY_CARE_PROVIDER_SITE_OTHER): Payer: Self-pay

## 2018-12-26 ENCOUNTER — Telehealth: Payer: Self-pay | Admitting: Hematology and Oncology

## 2018-12-26 NOTE — Telephone Encounter (Signed)
TALK with patient regarding video visit

## 2018-12-26 NOTE — Assessment & Plan Note (Signed)
DCIS low-grade left breast status post lumpectomy margin negative, ER positive status post adjuvant radiation completed 07/24/2013 currently on adjuvant tamoxifen that started 08/02/2013  Tamoxifen toxicities: 1. Hot flashes 2. Muscle stiffness She will finish 5 years of therapy by December 2019.  This will conclude her antiestrogen therapy.  Surveillance:  1. Breast exam 01/01/2018 no palpable lumps or nodules of concern 2. Mammogram 11/13/2017 benign.  Breast density category B  Obesity (BMI 35): I discussed with her once again about restarting on exercise and weight loss program.  Provided her regarding a weight loss clinic.  Return to clinic in 1 year for follow-up

## 2019-01-02 NOTE — Progress Notes (Signed)
HEMATOLOGY-ONCOLOGY DOXIMITY VISIT PROGRESS NOTE  I connected with Erica Roberts on 01/03/2019 at  9:00 AM EDT by phone conference and verified that I am speaking with the correct person using two identifiers.  I discussed the limitations, risks, security and privacy concerns of performing an evaluation and management service by Doximity and the availability of in person appointments.  I also discussed with the patient that there may be a patient responsible charge related to this service. The patient expressed understanding and agreed to proceed.  Patient was unable to joint on my chart because of technical issues   Patient's Location: Home Physician Location: Clinic  CHIEF COMPLIANT: Follow-up after completion of tamoxifen therapy for DCIS  INTERVAL HISTORY: Erica Roberts is a 48 y.o. female with above-mentioned history of left breast DCIS treated with lumpectomy and radiation. She completed antiestrogen therapy with tamoxifen in December 2019. I last saw her a year ago. She presents over Doximity today for annual follow-up.  She reports that all the side effects of tamoxifen have gone.  She is feeling great. Unfortunately she has not lost any weight because she is not making any concerted effort to lose it.  She tells me that she is planning on exercising daily and cutting down her food intake.  Oncology History  Cancer of central portion of left female breast ([place])  03/28/2013 Initial Diagnosis    Left breast biopsy: subgaleal: DCIS, ER 78%, PR 80% , low to intermediate grade   05/02/2013 Surgery    Left lumpectomy: no residual DCIS benign breast tissue   06/05/2013 - 07/24/2013 Radiation Therapy    Adjuvant radiation therapy   07/31/2013 - 06/2018 Anti-estrogen oral therapy    Tamoxifen 20 mg daily 5 years     REVIEW OF SYSTEMS:   Constitutional: Denies fevers, chills or abnormal weight loss Eyes: Denies blurriness of vision Ears, nose, mouth, throat, and face:  Denies mucositis or sore throat Respiratory: Denies cough, dyspnea or wheezes Cardiovascular: Denies palpitation, chest discomfort Gastrointestinal:  Denies nausea, heartburn or change in bowel habits Skin: Denies abnormal skin rashes Lymphatics: Denies new lymphadenopathy or easy bruising Neurological:Denies numbness, tingling or new weaknesses Behavioral/Psych: Mood is stable, no new changes  Extremities: No lower extremity edema Breast: denies any pain or lumps or nodules in either breasts All other systems were reviewed with the patient and are negative.  Observations/Objective:  There were no vitals filed for this visit. There is no height or weight on file to calculate BMI.  I have reviewed the data as listed CMP Latest Ref Rng & Units 07/08/2014 11/12/2013 11/06/2013  Glucose 70 - 140 mg/dl 96 96 93  BUN 7.0 - 26.0 mg/dL 11.9 18.6 14.8  Creatinine 0.6 - 1.1 mg/dL 0.9 1.0 0.9  Sodium 136 - 145 mEq/L 140 140 143  Potassium 3.5 - 5.1 mEq/L 3.5 4.6 5.3 No visable hemolysis(H)  Chloride 96 - 112 mEq/L - - -  CO2 22 - 29 mEq/L 26 24 26   Calcium 8.4 - 10.4 mg/dL 9.4 9.8 10.4  Total Protein 6.4 - 8.3 g/dL 6.9 - 7.7  Total Bilirubin 0.20 - 1.20 mg/dL 0.58 - 0.56  Alkaline Phos 40 - 150 U/L 47 - 45  AST 5 - 34 U/L 14 - 15  ALT 0 - 55 U/L 16 - 10    Lab Results  Component Value Date   WBC 5.5 07/08/2014   HGB 11.6 07/08/2014   HCT 36.0 07/08/2014   MCV 92.6 07/08/2014   PLT  235 07/08/2014   NEUTROABS 3.5 07/08/2014      Assessment Plan:  Cancer of central portion of left female breast DCIS low-grade left breast status post lumpectomy margin negative, ER positive status post adjuvant radiation completed 07/24/2013 currently on adjuvant tamoxifen that started 08/02/2013-December 2019  Tamoxifen toxicities: 1. Hot flashes 2. Muscle stiffness She completed 5 years of therapy by December 2019.    This concluded her antiestrogen therapy.  Surveillance:  1. Breast exam  01/01/2018 no palpable lumps or nodules of concern 2. Mammogram 11/13/2017 benign.  Breast density category B. She will make a new appt in a few weeks.  Obesity (BMI 35): I discussed with her once again about restarting on exercise and weight loss program. She will start walking and eating better.  Follow up in 1 year for follow up.   I discussed the assessment and treatment plan with the patient. The patient was provided an opportunity to ask questions and all were answered. The patient agreed with the plan and demonstrated an understanding of the instructions. The patient was advised to call back or seek an in-person evaluation if the symptoms worsen or if the condition fails to improve as anticipated.   I provided 15 minutes of Non face-to-face  time during this encounter.   Patient was unable to join on my chart because of technical issues.  Rulon Eisenmenger, MD 01/03/2019   I, Molly Dorshimer, am acting as scribe for Nicholas Lose, MD.  I have reviewed the above documentation for accuracy and completeness, and I agree with the above.

## 2019-01-03 ENCOUNTER — Inpatient Hospital Stay: Payer: BC Managed Care – PPO | Attending: Hematology and Oncology | Admitting: Hematology and Oncology

## 2019-01-03 DIAGNOSIS — Z170 Estrogen receptor positive status [ER+]: Secondary | ICD-10-CM

## 2019-01-03 DIAGNOSIS — Z923 Personal history of irradiation: Secondary | ICD-10-CM | POA: Diagnosis not present

## 2019-01-03 DIAGNOSIS — C50112 Malignant neoplasm of central portion of left female breast: Secondary | ICD-10-CM

## 2019-01-04 ENCOUNTER — Telehealth: Payer: Self-pay | Admitting: Hematology and Oncology

## 2019-01-04 NOTE — Telephone Encounter (Signed)
Could not reach patient the mailbox was full. I will mail

## 2019-04-15 ENCOUNTER — Other Ambulatory Visit: Payer: Self-pay | Admitting: Hematology and Oncology

## 2019-04-15 DIAGNOSIS — Z1231 Encounter for screening mammogram for malignant neoplasm of breast: Secondary | ICD-10-CM

## 2019-06-04 ENCOUNTER — Other Ambulatory Visit: Payer: Self-pay

## 2019-06-04 ENCOUNTER — Ambulatory Visit
Admission: RE | Admit: 2019-06-04 | Discharge: 2019-06-04 | Disposition: A | Discharge status: Home / Self Care | Payer: BC Managed Care – PPO | Source: Ambulatory Visit | Attending: Hematology and Oncology | Admitting: Hematology and Oncology

## 2019-06-04 DIAGNOSIS — Z1231 Encounter for screening mammogram for malignant neoplasm of breast: Secondary | ICD-10-CM | POA: Diagnosis not present

## 2019-09-23 ENCOUNTER — Telehealth: Payer: Self-pay | Admitting: *Deleted

## 2019-09-23 NOTE — Telephone Encounter (Signed)
Received VM from pt.  Attempt x1 to return call, no answer.  Unable to LVM due to VM being full.

## 2020-01-07 ENCOUNTER — Ambulatory Visit: Payer: BC Managed Care – PPO | Admitting: Hematology and Oncology

## 2020-01-14 ENCOUNTER — Ambulatory Visit: Payer: BC Managed Care – PPO | Admitting: Hematology and Oncology

## 2020-01-14 NOTE — Assessment & Plan Note (Deleted)
DCIS low-grade left breast status post lumpectomy margin negative, ER positive status post adjuvant radiation completed 07/24/2013 currently on adjuvant tamoxifen that started 08/02/2013-December 2019  Tamoxifen toxicities: 1. Hot flashes 2. Muscle stiffness She completed 5 years of therapy by December 2019.   This concluded her antiestrogen therapy.  Surveillance:  1. Breast exam6/22/2021no palpable lumps or nodules of concern 2. Mammogram11/05/2019 benign.Breast density category B.   Obesity (BMI 35):I discussed with her once again about restarting on exercise and weight loss program. She will start walking and eating better.  Follow up in 1 year for follow up

## 2020-12-16 ENCOUNTER — Other Ambulatory Visit: Payer: Self-pay | Admitting: Hematology and Oncology

## 2020-12-16 DIAGNOSIS — Z1231 Encounter for screening mammogram for malignant neoplasm of breast: Secondary | ICD-10-CM

## 2021-01-08 ENCOUNTER — Ambulatory Visit
Admission: RE | Admit: 2021-01-08 | Discharge: 2021-01-08 | Disposition: A | Discharge status: Home / Self Care | Payer: BC Managed Care – PPO | Source: Ambulatory Visit | Attending: Hematology and Oncology | Admitting: Hematology and Oncology

## 2021-01-08 ENCOUNTER — Other Ambulatory Visit: Payer: Self-pay

## 2021-01-08 DIAGNOSIS — Z1231 Encounter for screening mammogram for malignant neoplasm of breast: Secondary | ICD-10-CM

## 2021-08-02 ENCOUNTER — Other Ambulatory Visit: Payer: Self-pay | Admitting: Family Medicine

## 2021-08-02 DIAGNOSIS — Z1231 Encounter for screening mammogram for malignant neoplasm of breast: Secondary | ICD-10-CM

## 2021-12-22 IMAGING — MG MM DIGITAL SCREENING BILAT W/ TOMO AND CAD
6 of 12 series · 6 of 36 positions shown · non-contrast
Comparison: Previous exam(s).

CLINICAL DATA: Screening.

EXAM:
DIGITAL SCREENING BILATERAL MAMMOGRAM WITH TOMOSYNTHESIS AND CAD
TECHNIQUE: Bilateral screening digital craniocaudal and mediolateral oblique
mammograms were obtained. Bilateral screening digital breast
tomosynthesis was performed. The images were evaluated with
computer-aided detection.

[R MLO synth-2D]
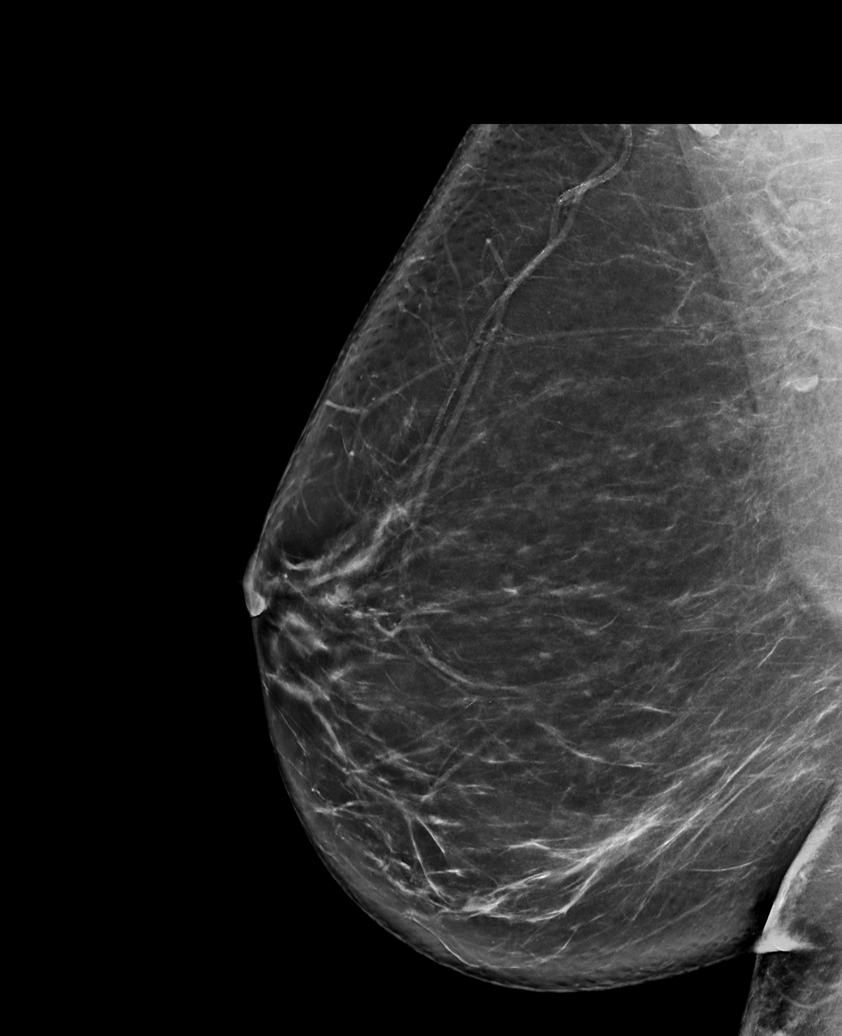

[R CC synth-2D (1 of 2)]
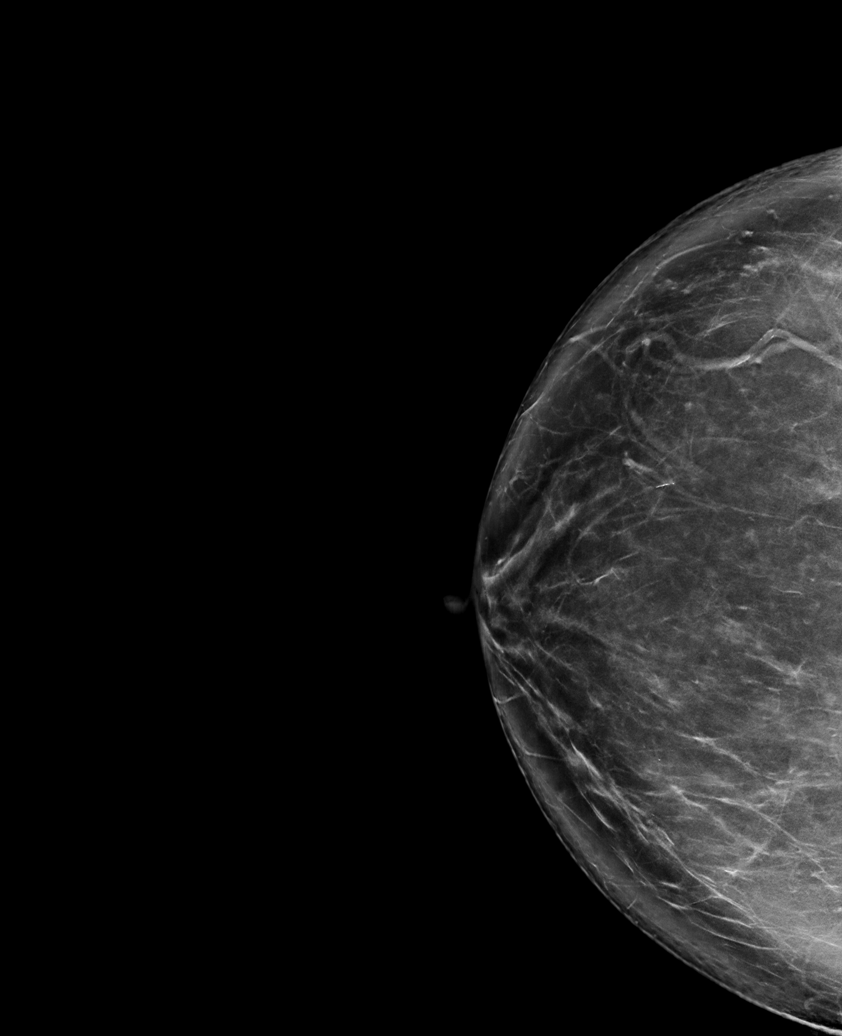

[R CC synth-2D (2 of 2)]
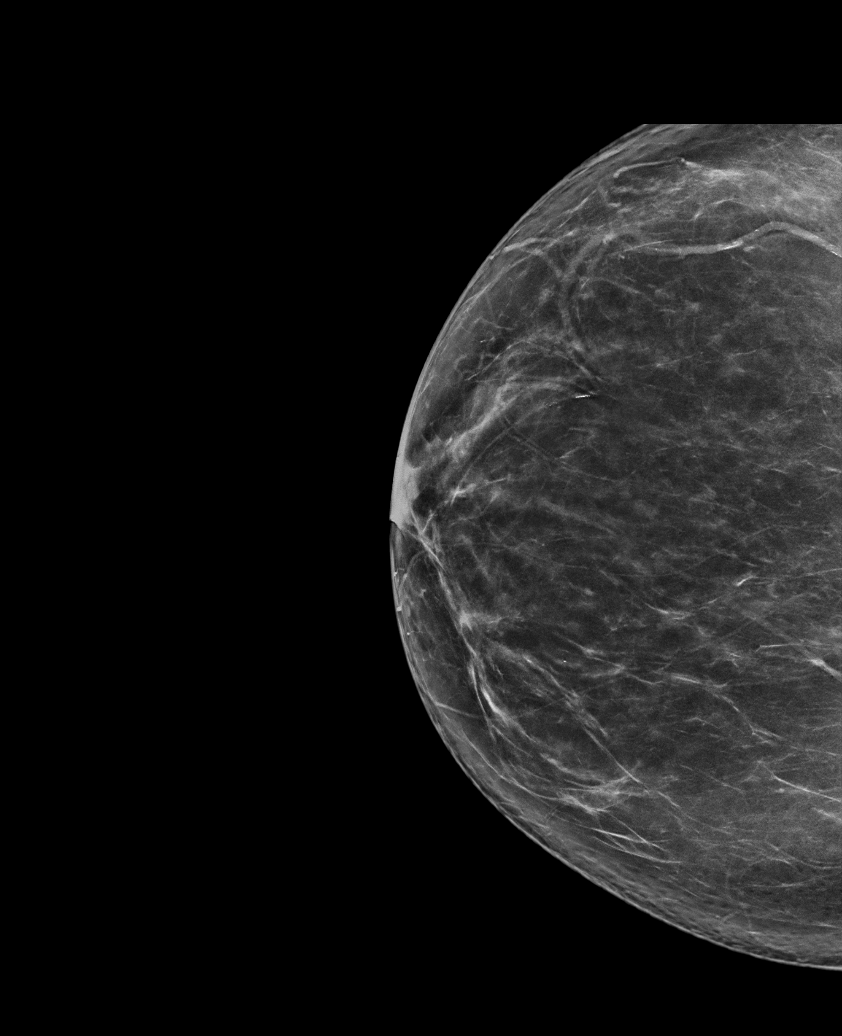

[L MLO synth-2D]
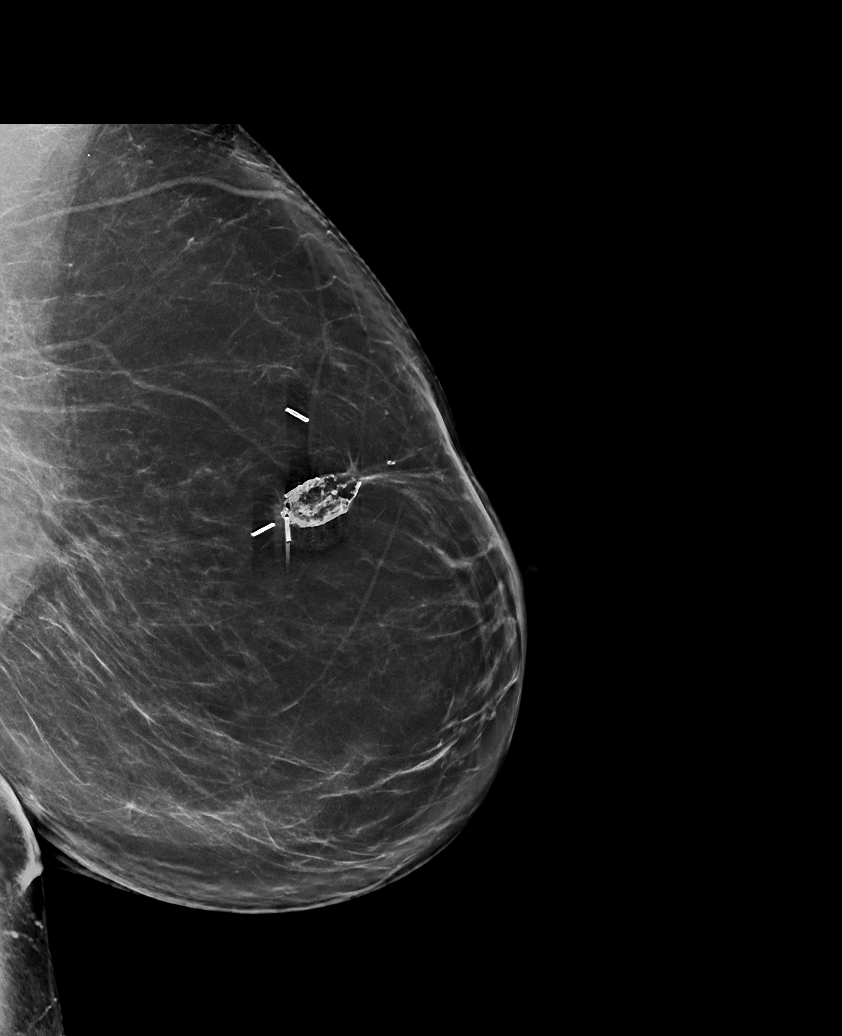

[L CC synth-2D (1 of 2)]
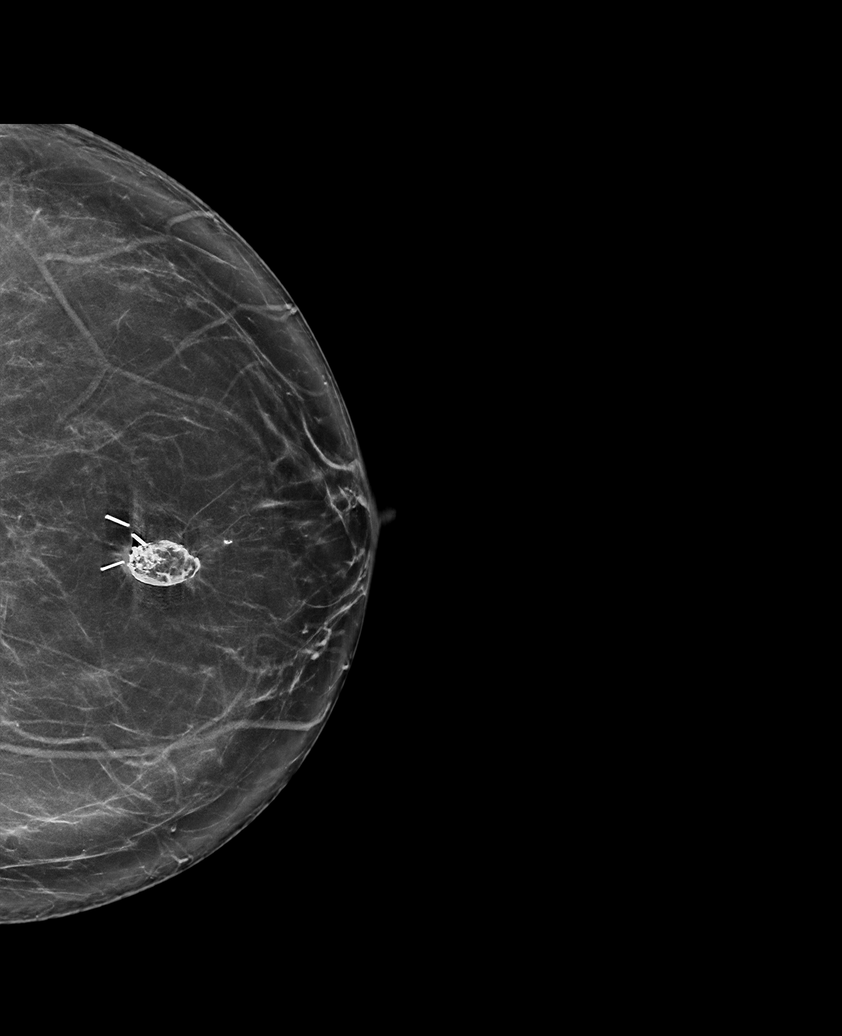

[L CC synth-2D (2 of 2)]
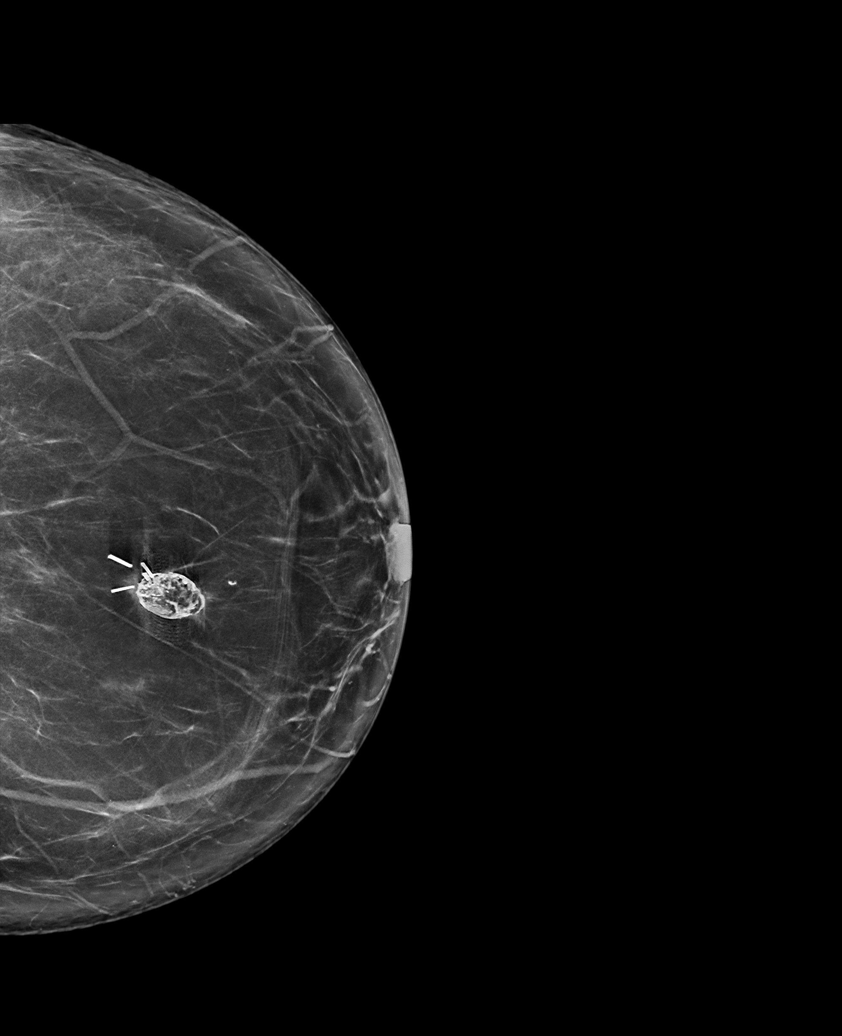

[6 of 36 positions shown; findings below may reference images not displayed]

ACR Breast Density Category b: There are scattered areas of
fibroglandular density.
FINDINGS: There are no findings suspicious for malignancy.
IMPRESSION: No mammographic evidence of malignancy. A result letter of this
screening mammogram will be mailed directly to the patient.

RECOMMENDATION:
Screening mammogram in one year. (Code:51-O-LD2)

BI-RADS CATEGORY  1: Negative.
